# Patient Record
Sex: Female | Born: 1990 | Hispanic: Yes | Marital: Married | Smoking: Never smoker
Health system: Southern US, Community
[De-identification: ages and names within clinical notes are randomized; demographics above are authoritative.]

## PROBLEM LIST (undated history)

## (undated) DIAGNOSIS — J90 Pleural effusion, not elsewhere classified: Secondary | ICD-10-CM

## (undated) DIAGNOSIS — R51 Headache: Secondary | ICD-10-CM

## (undated) DIAGNOSIS — I499 Cardiac arrhythmia, unspecified: Secondary | ICD-10-CM

## (undated) DIAGNOSIS — A150 Tuberculosis of lung: Secondary | ICD-10-CM

## (undated) HISTORY — PX: PLEURAL EFFUSION DRAINAGE: SHX5099

## (undated) HISTORY — PX: NO PAST SURGERIES: SHX2092

---
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
## 2012-01-18 ENCOUNTER — Encounter: Payer: Self-pay | Admitting: Internal Medicine

## 2012-01-21 ENCOUNTER — Other Ambulatory Visit: Payer: Self-pay | Admitting: Infectious Diseases

## 2012-01-21 ENCOUNTER — Ambulatory Visit
Admission: RE | Admit: 2012-01-21 | Discharge: 2012-01-21 | Disposition: A | Discharge status: Home / Self Care | Payer: No Typology Code available for payment source | Source: Ambulatory Visit | Attending: Infectious Diseases | Admitting: Infectious Diseases

## 2012-01-21 DIAGNOSIS — A150 Tuberculosis of lung: Secondary | ICD-10-CM

## 2012-04-04 ENCOUNTER — Ambulatory Visit
Admission: RE | Admit: 2012-04-04 | Discharge: 2012-04-04 | Disposition: A | Discharge status: Home / Self Care | Payer: No Typology Code available for payment source | Source: Ambulatory Visit | Attending: Infectious Diseases | Admitting: Infectious Diseases

## 2012-04-04 ENCOUNTER — Other Ambulatory Visit: Payer: Self-pay | Admitting: Infectious Diseases

## 2012-04-04 DIAGNOSIS — A150 Tuberculosis of lung: Secondary | ICD-10-CM

## 2013-12-06 IMAGING — CR DG CHEST 2V
2 series · 2 of 2 positions shown · non-contrast
Comparison: None

CLINICAL DATA: Fatigue, left chest pain, shortness of breath,
anemia hemoglobin

CHEST - 2 VIEW

[w chest pa]
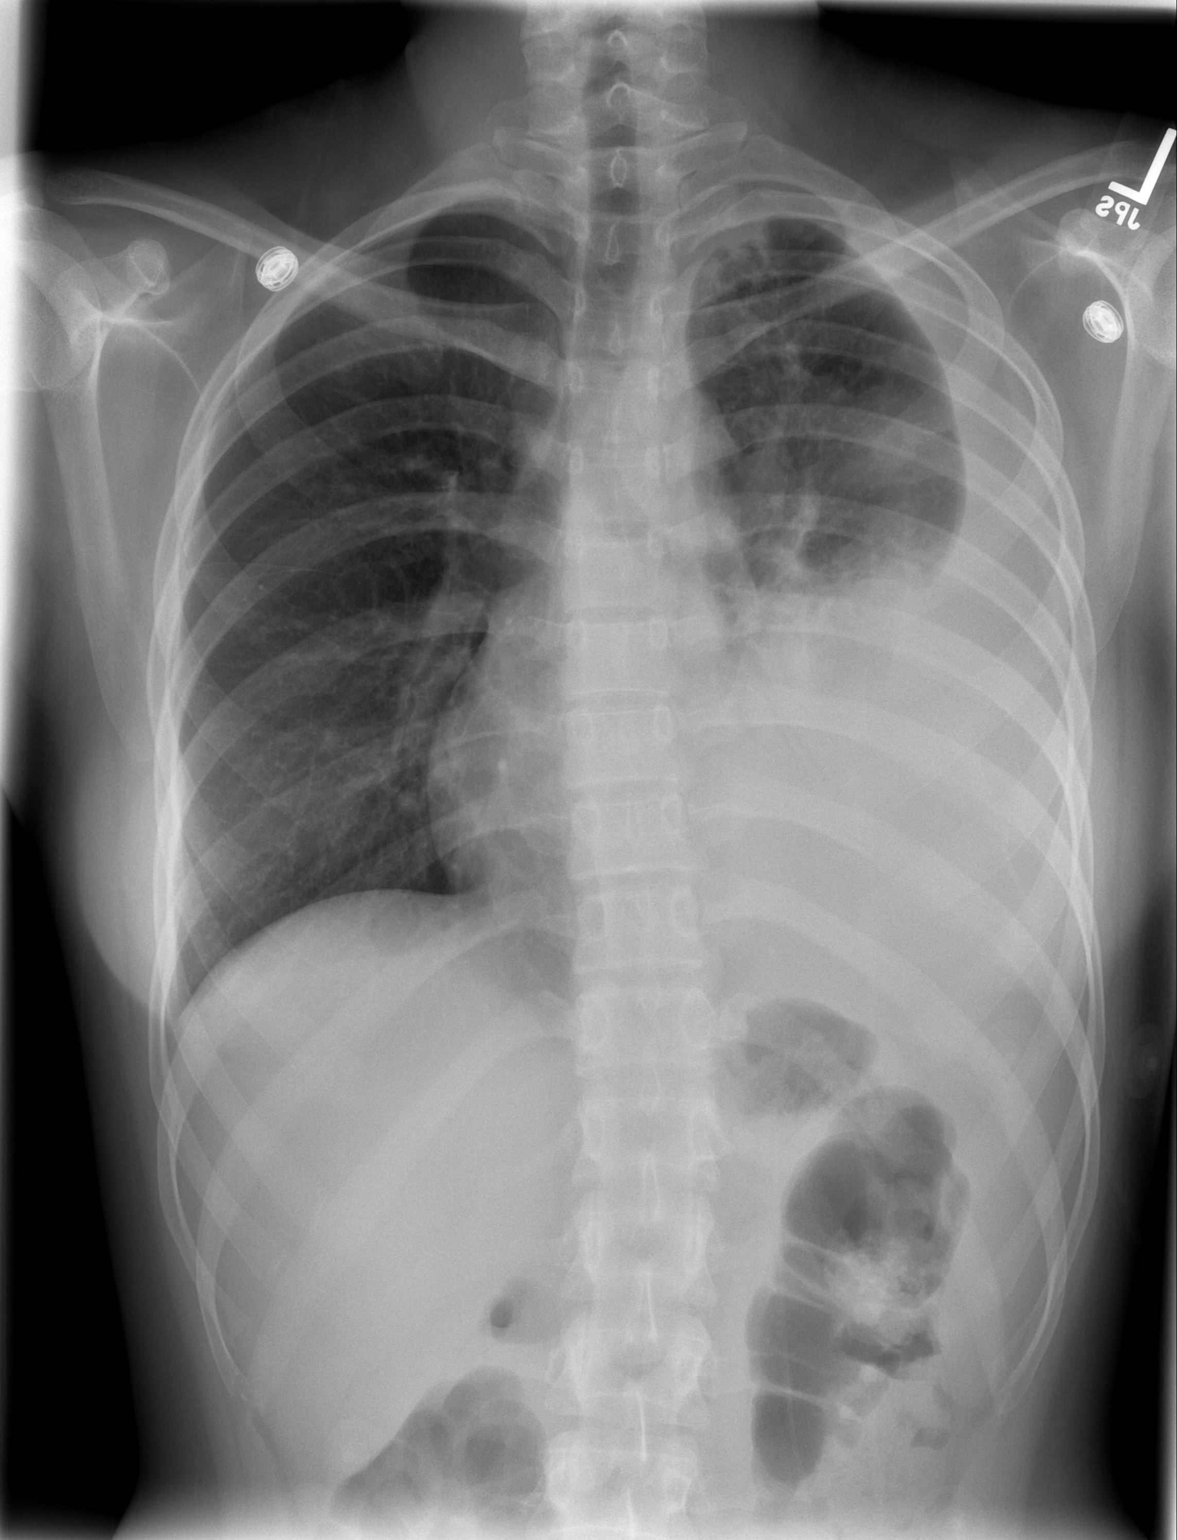

[w chest lat]
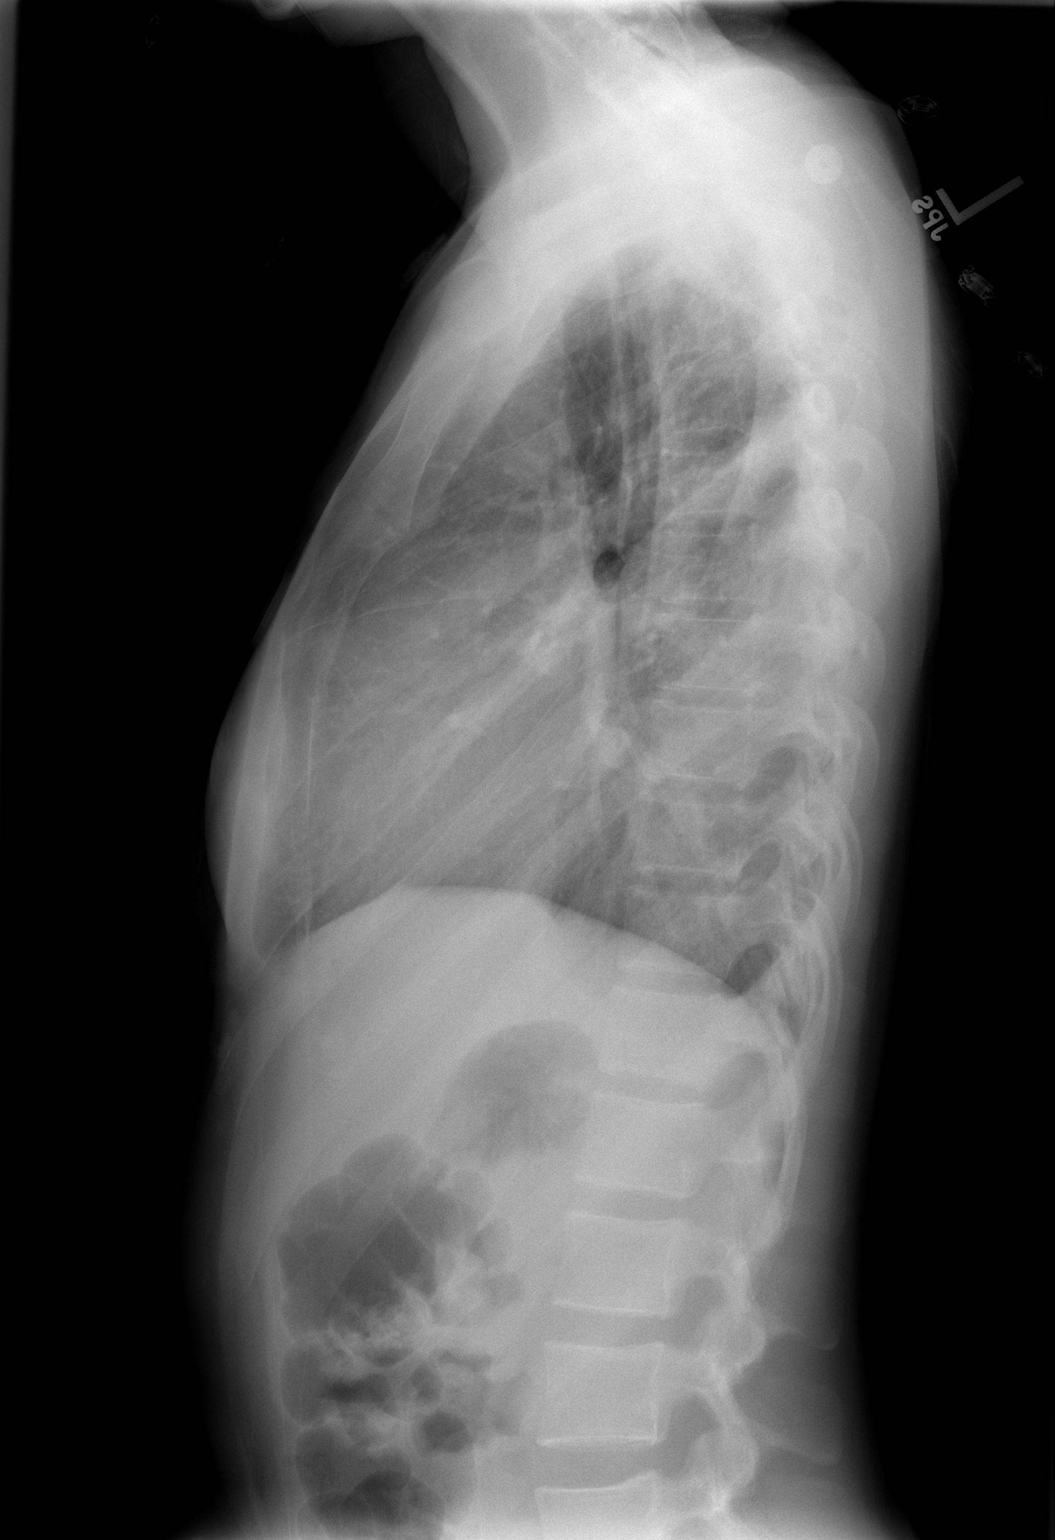

[2 of 2 positions shown; findings below may reference images not displayed]

FINDINGS: Normal heart size and mediastinal contours.
Large left pleural effusion.
Atelectasis of the lower left lung with probable left upper lobe
infiltrate.
Right lung clear.
No pneumothorax.
No acute osseous findings.
IMPRESSION: Large left pleural effusion with significant atelectasis of the
lower left lung.
Probable infiltrate in the left upper lobe.
Underlying abnormalities in the lower left lung are not excluded in
this setting.

Findings discussed with Dr. Maura on 10/02/2011 at 2987 hours.

## 2013-12-06 IMAGING — US US THORACENTESIS ASP PLEURAL SPACE W/IMG GUIDE
1 series · 5 of 5 positions shown · non-contrast
Comparison: None

CLINICAL DATA: Fatigue, left flank pain, left-sided pleural
effusion of uncertain etiology in a 21-year-old female

ULTRASOUND GUIDED left THORACENTESIS

[Series 1: us thoracentesis asp pleural space w/img guide · 0.32mm/px · 5 of 5 slices shown]
[im 1/5]
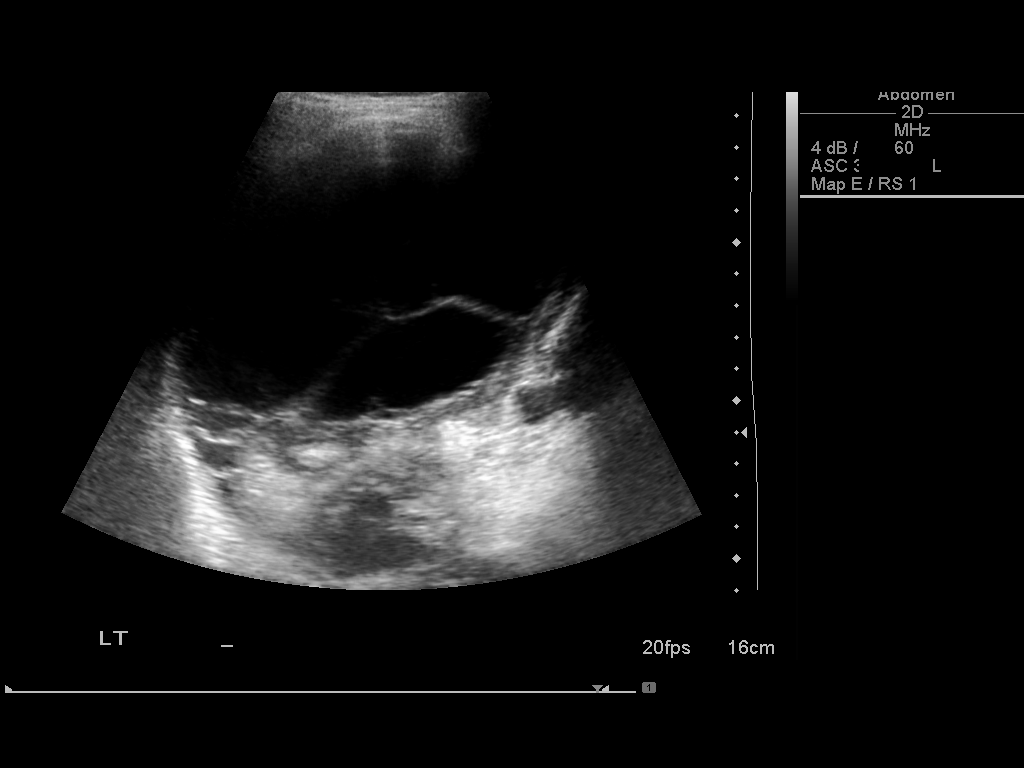
[im 2/5]
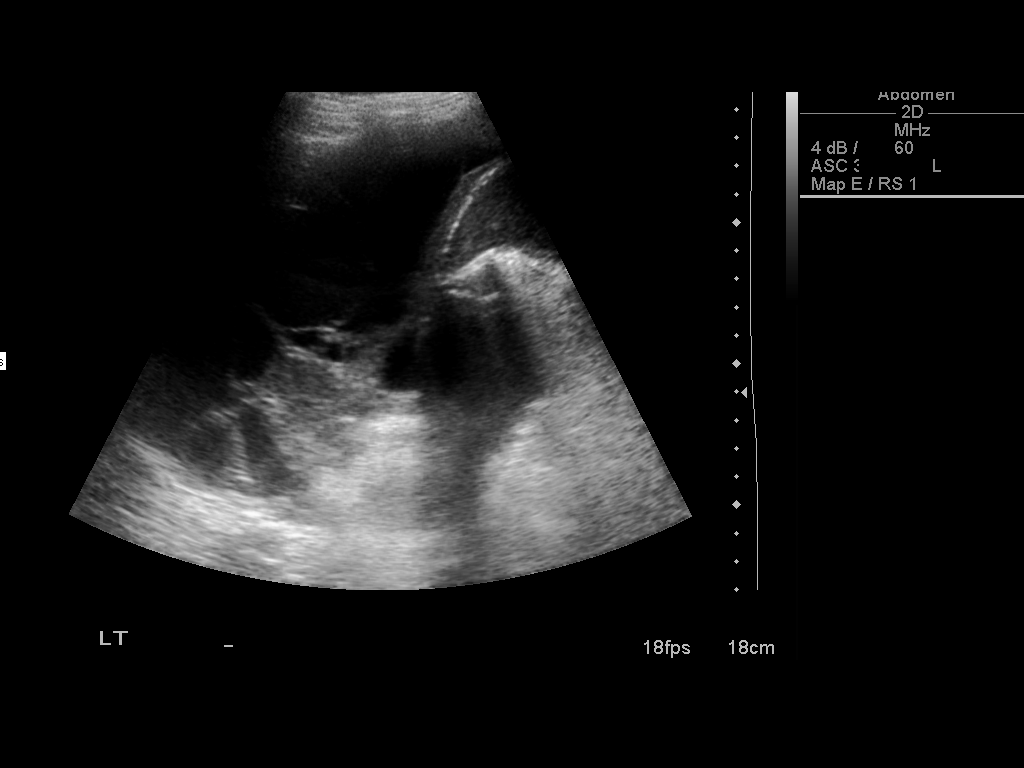
[im 3/5]
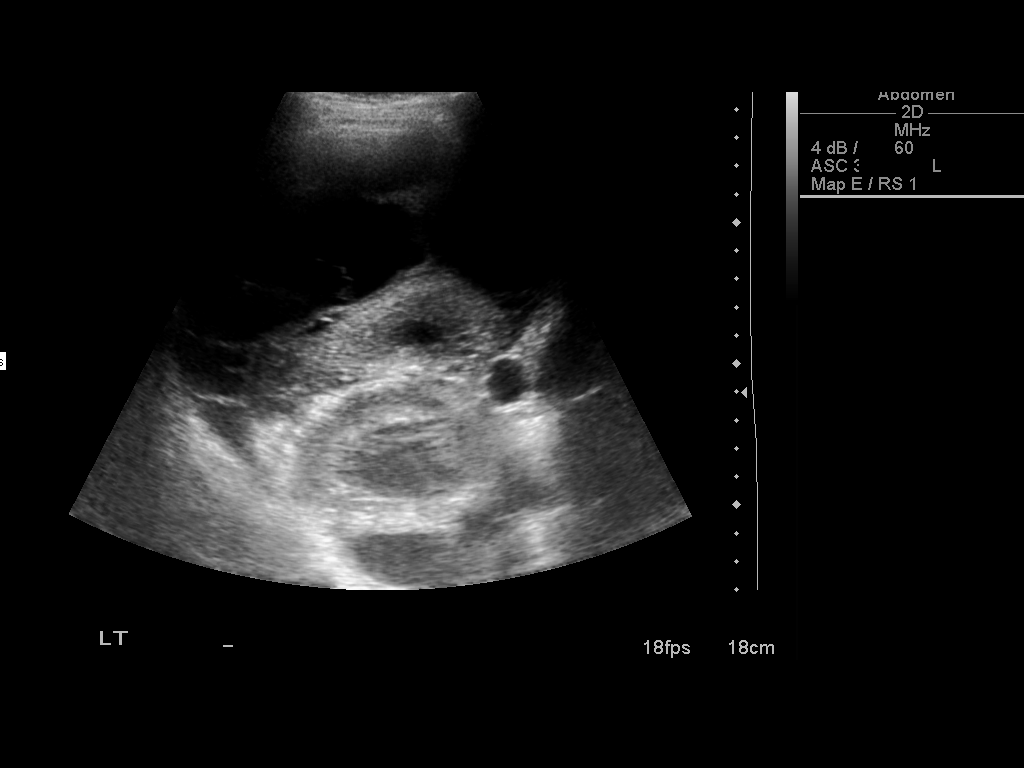
[im 4/5]
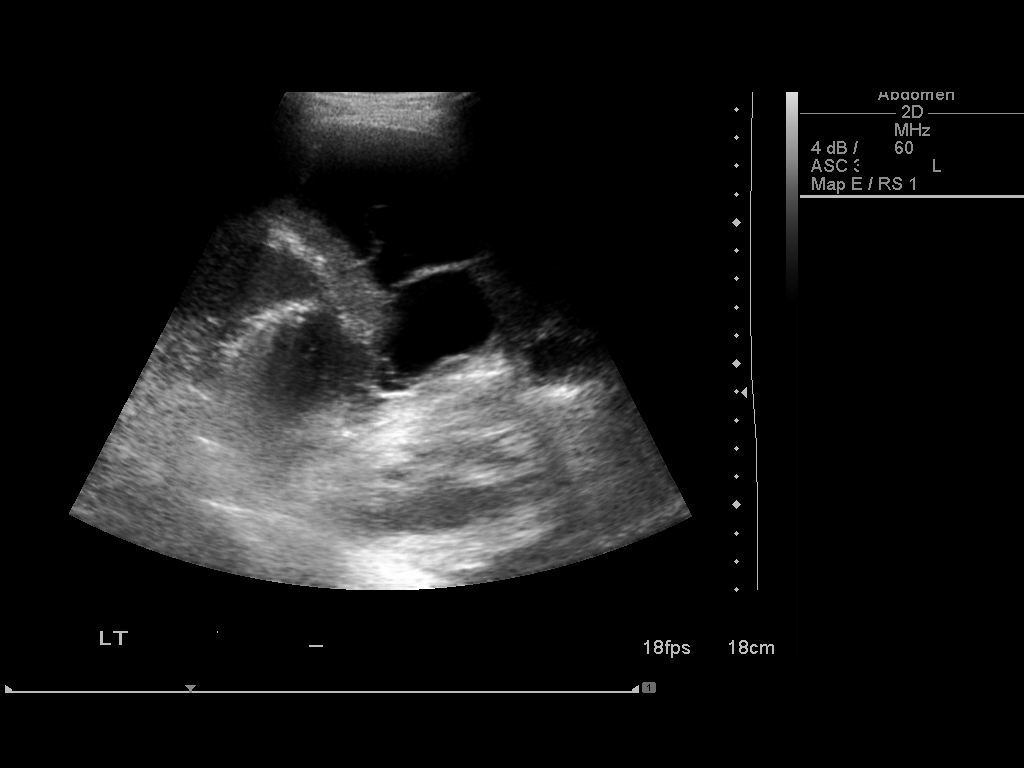
[im 5/5]
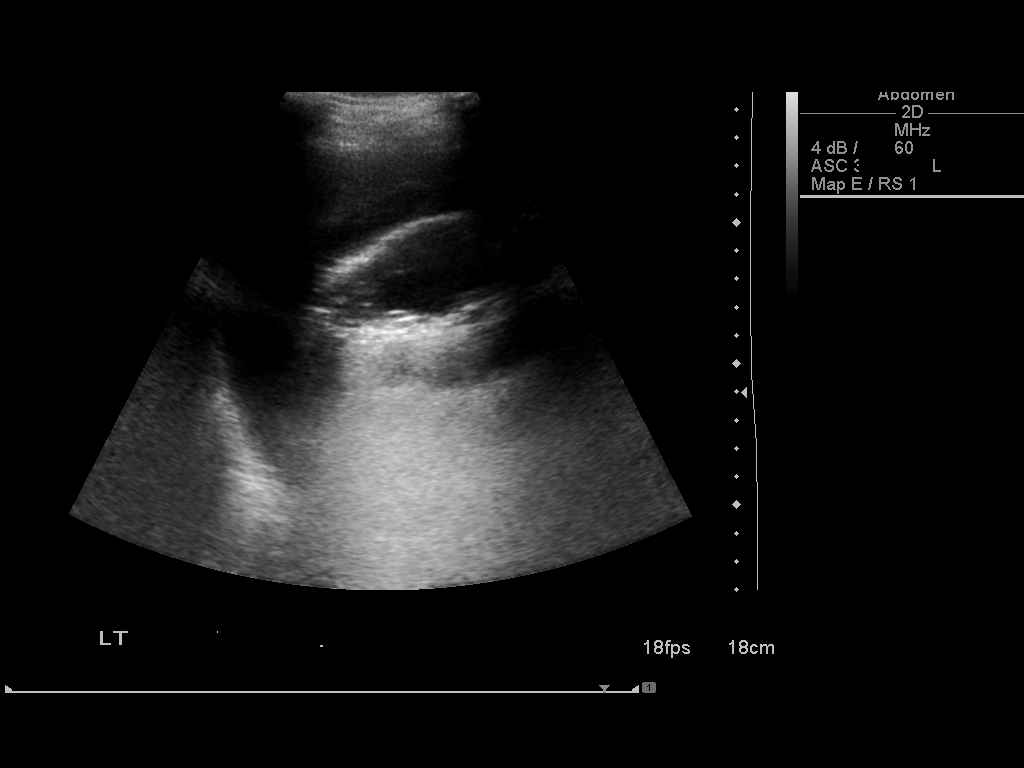

[5 of 5 positions shown; findings below may reference images not displayed]

An ultrasound guided thoracentesis was thoroughly discussed with
the patient and questions answered.  The benefits, risks,
alternatives and complications were also discussed.  The patient
understands and wishes to proceed with the procedure.  Written
consent was obtained.

Ultrasound was performed to localize and mark an adequate pocket of
fluid in the left chest.  The effusion is noted to be very
loculated and the largest of the collections was targeted. The area
was then prepped and draped in the normal sterile fashion.  1%
Lidocaine was used for local anesthesia.  Under ultrasound guidance
a 19 gauge Yueh catheter was introduced.  Thoracentesis was
performed.  The catheter was removed and a dressing applied.

Complications:  The patient experienced mild referred (L)shoulder
discomfort near the end of the procedure.
FINDINGS: A total of approximately 500 ml of clear yellow fluid was
removed. A fluid sample was sent for laboratory analysis.
IMPRESSION: Successful ultrasound guided left thoracentesis yielding 500 ml of
pleural fluid.
Residual loculated left pleural effusion.

Read by Friskydon Bacchas

## 2017-03-12 NOTE — L&D Delivery Note (Signed)
[facility]N [facility] Practice Delivery Note  Gloria Kent is a 27 y.o. G1P1001 s/p IOL at [redacted]w[redacted]d. She was admitted for IOL.   ROM: 21h 62m with clear fluid GBS Status: positive Maximum Maternal Temperature: 100  Labor Progress: . Slow 2nd stage, total 7 hours  Delivery Date/Time: [phone] Delivery: Vacuum-assisted vaginal delivery I was called regarding patient at approximately 0 610 At that time I was informed that she had been complete since approximately 00 30 She was allowed to "labor down" for a couple of hours She then pushed for an hour so rested for an hour or so pushed for an hour or so and then they called me I was basically called for evaluation of an operative vaginal delivery  Fetal heart rate tracing was reassuring Labor was deemed to be somewhat suboptimal for pushing While pushing with the patient for a couple of contractions her maternal effort was poor mostly related I thought to the density of her epidural, she was completely unaware of her contractions and had to be told when to push  At that time I made the decision to turn off the epidural and to start increasing the oxytocin once again The patient began to get more of an urge to push and so she began to push with every contractions sometimes every other contraction By about 07 30 she was pushing effectively and there was positive further molding of the fetal vertex and positive station advance  At this point I discussed placing the vacuum extractor with the patient and her husband and they agreed to proceed with that  I applied the bail vacuum extractor and would remove the suction between contractions  The baby was delivered in 6 contractions using the vacuum extractor There were no pop offs  Delivery of the shoulders was accomplished by rotating the anterior shoulder from approximately 3:00 to 12:00 This was probably the reason for the slow descent and prolonged second stage along with poor maternal  effort  The resulting partial third-degree laceration was repaired but difficulty The rectal sphincter fascia was reinforced with 2-0 Monocryl The vaginal mucosa and perineum were closed using 3-0 Monocryl interrupted There was good return of anatomy after the repair was finished  Placenta: intact Complications: none Lacerations: partial 3rd degree EBL: 500cc  Postpartum Planning [ ]  message to sent to schedule follow-up  [ ]  vaccines UTD  Infant: 8  APGARs 9  [id number]   Rockne Coons MD Attending Physician for [facility]

## 2017-05-20 DIAGNOSIS — Z3402 Encounter for supervision of normal first pregnancy, second trimester: Secondary | ICD-10-CM | POA: Diagnosis not present

## 2017-05-20 LAB — OB RESULTS CONSOLE ABO/RH: RH Type: POSITIVE

## 2017-05-20 LAB — OB RESULTS CONSOLE RUBELLA ANTIBODY, IGM: RUBELLA: IMMUNE

## 2017-05-20 LAB — OB RESULTS CONSOLE GBS: STREP GROUP B AG: POSITIVE

## 2017-05-20 LAB — OB RESULTS CONSOLE GC/CHLAMYDIA
CHLAMYDIA, DNA PROBE: NEGATIVE
GC PROBE AMP, GENITAL: NEGATIVE

## 2017-05-20 LAB — OB RESULTS CONSOLE HEPATITIS B SURFACE ANTIGEN: Hepatitis B Surface Ag: NEGATIVE

## 2017-05-20 LAB — OB RESULTS CONSOLE HIV ANTIBODY (ROUTINE TESTING): HIV: NONREACTIVE

## 2017-05-20 LAB — OB RESULTS CONSOLE ANTIBODY SCREEN: Antibody Screen: NEGATIVE

## 2017-05-20 LAB — OB RESULTS CONSOLE RPR: RPR: NONREACTIVE

## 2017-09-20 ENCOUNTER — Other Ambulatory Visit (HOSPITAL_COMMUNITY): Payer: Self-pay | Admitting: Nurse Practitioner

## 2017-09-20 DIAGNOSIS — Z3689 Encounter for other specified antenatal screening: Secondary | ICD-10-CM

## 2017-09-23 ENCOUNTER — Encounter (HOSPITAL_COMMUNITY): Payer: Self-pay

## 2017-09-30 ENCOUNTER — Ambulatory Visit (HOSPITAL_COMMUNITY)
Admission: RE | Admit: 2017-09-30 | Discharge: 2017-09-30 | Disposition: A | Discharge status: Home / Self Care | Payer: Medicaid Other | Source: Ambulatory Visit | Attending: Nurse Practitioner | Admitting: Nurse Practitioner

## 2017-09-30 DIAGNOSIS — Z363 Encounter for antenatal screening for malformations: Secondary | ICD-10-CM | POA: Diagnosis present

## 2017-09-30 DIAGNOSIS — Z3689 Encounter for other specified antenatal screening: Secondary | ICD-10-CM

## 2017-09-30 DIAGNOSIS — Z3A36 36 weeks gestation of pregnancy: Secondary | ICD-10-CM | POA: Diagnosis not present

## 2017-10-28 ENCOUNTER — Telehealth (HOSPITAL_COMMUNITY): Payer: Self-pay | Admitting: *Deleted

## 2017-10-28 ENCOUNTER — Other Ambulatory Visit (HOSPITAL_COMMUNITY): Payer: Self-pay | Admitting: Nurse Practitioner

## 2017-10-28 ENCOUNTER — Encounter (HOSPITAL_COMMUNITY): Payer: Self-pay | Admitting: *Deleted

## 2017-10-28 DIAGNOSIS — O480 Post-term pregnancy: Secondary | ICD-10-CM

## 2017-10-28 NOTE — Telephone Encounter (Signed)
Preadmission screen  

## 2017-10-29 ENCOUNTER — Other Ambulatory Visit: Payer: Self-pay

## 2017-10-29 ENCOUNTER — Inpatient Hospital Stay (HOSPITAL_COMMUNITY)
Admission: AD | Admit: 2017-10-29 | Discharge: 2017-10-29 | Disposition: A | Discharge status: Home / Self Care | Payer: Medicaid Other | Source: Ambulatory Visit | Attending: Obstetrics and Gynecology | Admitting: Obstetrics and Gynecology

## 2017-10-29 ENCOUNTER — Encounter (HOSPITAL_COMMUNITY): Payer: Self-pay

## 2017-10-29 DIAGNOSIS — O368130 Decreased fetal movements, third trimester, not applicable or unspecified: Secondary | ICD-10-CM | POA: Diagnosis present

## 2017-10-29 DIAGNOSIS — Z3A40 40 weeks gestation of pregnancy: Secondary | ICD-10-CM | POA: Diagnosis not present

## 2017-10-29 DIAGNOSIS — Z3689 Encounter for other specified antenatal screening: Secondary | ICD-10-CM

## 2017-10-29 NOTE — Discharge Instructions (Signed)

## 2017-10-29 NOTE — MAU Note (Signed)
Pt states that she started feeling her baby move less than usual last night.   She states she felt a small movement on the way over here in the car.  Denies vaginal bleeding, Denies LOF

## 2017-10-29 NOTE — MAU Provider Note (Signed)
History     CSN: [id number]  Arrival date and time: 10/29/17 2030   First Provider Initiated Contact with Patient 10/29/17 2117      Chief Complaint  Patient presents with  . Decreased Fetal Movement   G1 @40 .1 wks here with decreased FM since last night. Denies VB, LOF, or ctx. She is eating and drinking well. She is feeling FM but less than before yesterday. She is getting [facility] at [facility], no complications.   OB History    Gravida  1   Para      Term      Preterm      AB      Living        SAB      TAB      Ectopic      Multiple      Live Births              Past Medical History:  Diagnosis Date  . Dysrhythmia 10/02/11   "lately heart is beating faster"  . Headache(784.0) 10/02/11   "weekly"  . Pleural effusion 10/02/11  . TB (pulmonary tuberculosis) questionable   hospitalized in 2014 treated until negative    Past Surgical History:  Procedure Laterality Date  . NO PAST SURGERIES    . PLEURAL EFFUSION DRAINAGE  10/04/2011   Procedure: DRAINAGE OF PLEURAL EFFUSION;  Surgeon: Alleen BorneBryan K Bartle, MD;  Location: [facility] OR;  Service: Thoracic;  Laterality: Left;    Family History  Problem Relation Age of Onset  . Hypertension Maternal Grandmother     Social History   Tobacco Use  . Smoking status: Never Smoker  . Smokeless tobacco: Never Used  Substance Use Topics  . Alcohol use: No  . Drug use: No    Allergies: No Known Allergies  Medications Prior to Admission  Medication Sig Dispense Refill Last Dose  . traMADol (ULTRAM) 50 MG tablet Take 50-100 mg by mouth every 6 (six) hours as needed. For pain   Not Taking    Review of Systems  Gastrointestinal: Negative for abdominal pain.  Genitourinary: Negative for vaginal bleeding and vaginal discharge.   Physical Exam   Blood pressure 120/70, pulse 91, temperature 97.8 F (36.6 C), resp. rate 16, weight 74.4 kg, SpO2 99 %.  Physical Exam  Constitutional: She is oriented to person, place, and  time. She appears well-developed and well-nourished. No distress.  HENT:  Head: Normocephalic and atraumatic.  Neck: Normal range of motion.  Cardiovascular: Normal rate.  Respiratory: No respiratory distress.  GI: Soft. She exhibits no distension. There is no tenderness.  gravid  Musculoskeletal: Normal range of motion.  Neurological: She is alert and oriented to person, place, and time.  Skin: Skin is warm and dry.  Psychiatric: She has a normal mood and affect.  EFM: 130 bpm, mod variability, + accels, no decels Toco: irregular  No results found for this or any previous visit (from the past 24 hour(s)).  MAU Course  Procedures  MDM Pt feeling good FM since EFM applied. NST reactive. Stable for discharge home.   Assessment and Plan   1. [redacted] weeks gestation of pregnancy   2. NST (non-stress test) reactive   3. Decreased fetal movements in third trimester, single or unspecified fetus    Discharge home [facility] as scheduled FMCs  Allergies as of 10/29/2017   No Known Allergies     Medication List    STOP taking  these medications   traMADol 50 MG tablet Commonly known as:  Julieanne MansonULTRAM      Gracey Tolle, CNM 10/29/2017, 9:18 PM

## 2017-10-30 ENCOUNTER — Other Ambulatory Visit: Payer: Self-pay | Admitting: Advanced Practice Midwife

## 2017-10-31 ENCOUNTER — Ambulatory Visit (HOSPITAL_COMMUNITY)
Admission: RE | Admit: 2017-10-31 | Discharge: 2017-10-31 | Disposition: A | Discharge status: Home / Self Care | Payer: Medicaid Other | Source: Ambulatory Visit | Attending: Nurse Practitioner | Admitting: Nurse Practitioner

## 2017-10-31 DIAGNOSIS — O480 Post-term pregnancy: Secondary | ICD-10-CM | POA: Diagnosis present

## 2017-10-31 DIAGNOSIS — Z3A40 40 weeks gestation of pregnancy: Secondary | ICD-10-CM | POA: Insufficient documentation

## 2017-11-02 ENCOUNTER — Encounter (HOSPITAL_COMMUNITY): Payer: Self-pay | Admitting: *Deleted

## 2017-11-02 ENCOUNTER — Inpatient Hospital Stay (HOSPITAL_COMMUNITY)
Admission: AD | Admit: 2017-11-02 | Discharge: 2017-11-06 | DRG: 768 | Disposition: A | Discharge status: Home / Self Care | Payer: Medicaid Other | Attending: Obstetrics and Gynecology | Admitting: Obstetrics and Gynecology

## 2017-11-02 DIAGNOSIS — O4693 Antepartum hemorrhage, unspecified, third trimester: Secondary | ICD-10-CM | POA: Diagnosis present

## 2017-11-02 DIAGNOSIS — O99824 Streptococcus B carrier state complicating childbirth: Secondary | ICD-10-CM | POA: Diagnosis present

## 2017-11-02 DIAGNOSIS — Z8611 Personal history of tuberculosis: Secondary | ICD-10-CM

## 2017-11-02 DIAGNOSIS — Z3A40 40 weeks gestation of pregnancy: Secondary | ICD-10-CM | POA: Diagnosis not present

## 2017-11-02 DIAGNOSIS — O480 Post-term pregnancy: Principal | ICD-10-CM | POA: Diagnosis present

## 2017-11-02 LAB — TYPE AND SCREEN
ABO/RH(D): O POS
Antibody Screen: NEGATIVE

## 2017-11-02 LAB — CBC
HCT: 31.7 % — ABNORMAL LOW (ref 36.0–46.0)
Hemoglobin: 9.5 g/dL — ABNORMAL LOW (ref 12.0–15.0)
MCH: 20.8 pg — AB (ref 26.0–34.0)
MCHC: 30 g/dL (ref 30.0–36.0)
MCV: 69.5 fL — AB (ref 78.0–100.0)
Platelets: 248 10*3/uL (ref 150–400)
RBC: 4.56 MIL/uL (ref 3.87–5.11)
RDW: 21.6 % — ABNORMAL HIGH (ref 11.5–15.5)
WBC: 5 10*3/uL (ref 4.0–10.5)

## 2017-11-02 LAB — RPR: RPR: NONREACTIVE

## 2017-11-02 LAB — ABO/RH: ABO/RH(D): O POS

## 2017-11-02 MED ORDER — LACTATED RINGERS IV SOLN
500.0000 mL | INTRAVENOUS | Status: DC | PRN
  Administered 2017-11-03 (×2): 500 mL via INTRAVENOUS
  Administered 2017-11-04: 1000 mL via INTRAVENOUS

## 2017-11-02 MED ORDER — ZOLPIDEM TARTRATE 5 MG PO TABS: 5.0000 mg | ORAL_TABLET | Freq: Every evening | ORAL | Status: DC | PRN

## 2017-11-02 MED ORDER — LIDOCAINE HCL (PF) 1 % IJ SOLN
30.0000 mL | INTRAMUSCULAR | Status: AC | PRN
  Administered 2017-11-04 (×2): 30 mL via SUBCUTANEOUS
  Filled 2017-11-02 (×2): qty 30

## 2017-11-02 MED ORDER — SOD CITRATE-CITRIC ACID 500-334 MG/5ML PO SOLN: 30.0000 mL | ORAL | Status: DC | PRN

## 2017-11-02 MED ORDER — OXYCODONE-ACETAMINOPHEN 5-325 MG PO TABS: 1.0000 | ORAL_TABLET | ORAL | Status: DC | PRN

## 2017-11-02 MED ORDER — FENTANYL CITRATE (PF) 100 MCG/2ML IJ SOLN
100.0000 ug | INTRAMUSCULAR | Status: DC | PRN
  Administered 2017-11-03 (×2): 100 ug via INTRAVENOUS
  Filled 2017-11-02 (×2): qty 2

## 2017-11-02 MED ORDER — PENICILLIN G 3 MILLION UNITS IVPB - SIMPLE MED
3.0000 10*6.[IU] | INTRAVENOUS | Status: DC
  Administered 2017-11-02 – 2017-11-04 (×12): 3 10*6.[IU] via INTRAVENOUS
  Filled 2017-11-02: qty 100
  Filled 2017-11-02 (×5): qty 3
  Filled 2017-11-02: qty 100
  Filled 2017-11-02: qty 3
  Filled 2017-11-02 (×2): qty 100
  Filled 2017-11-02 (×2): qty 3
  Filled 2017-11-02: qty 100
  Filled 2017-11-02: qty 3
  Filled 2017-11-02: qty 100
  Filled 2017-11-02 (×3): qty 3

## 2017-11-02 MED ORDER — LACTATED RINGERS IV SOLN
INTRAVENOUS | Status: DC
  Administered 2017-11-02 – 2017-11-03 (×4): via INTRAVENOUS

## 2017-11-02 MED ORDER — ONDANSETRON HCL 4 MG/2ML IJ SOLN
4.0000 mg | Freq: Four times a day (QID) | INTRAMUSCULAR | Status: DC | PRN
  Administered 2017-11-03 (×2): 4 mg via INTRAVENOUS
  Filled 2017-11-02 (×2): qty 2

## 2017-11-02 MED ORDER — OXYTOCIN BOLUS FROM INFUSION
500.0000 mL | Freq: Once | INTRAVENOUS | Status: AC
  Administered 2017-11-04: 500 mL via INTRAVENOUS

## 2017-11-02 MED ORDER — SODIUM CHLORIDE 0.9 % IV SOLN
5.0000 10*6.[IU] | Freq: Once | INTRAVENOUS | Status: AC
  Administered 2017-11-02: 5 10*6.[IU] via INTRAVENOUS
  Filled 2017-11-02: qty 5

## 2017-11-02 MED ORDER — OXYCODONE-ACETAMINOPHEN 5-325 MG PO TABS: 2.0000 | ORAL_TABLET | ORAL | Status: DC | PRN

## 2017-11-02 MED ORDER — TERBUTALINE SULFATE 1 MG/ML IJ SOLN
0.2500 mg | Freq: Once | INTRAMUSCULAR | Status: DC | PRN
  Filled 2017-11-02: qty 1

## 2017-11-02 MED ORDER — OXYTOCIN 40 UNITS IN LACTATED RINGERS INFUSION - SIMPLE MED
2.5000 [IU]/h | INTRAVENOUS | Status: DC
  Administered 2017-11-04: 2.5 [IU]/h via INTRAVENOUS

## 2017-11-02 MED ORDER — MISOPROSTOL 50MCG HALF TABLET
50.0000 ug | ORAL_TABLET | ORAL | Status: DC | PRN
  Administered 2017-11-02 – 2017-11-03 (×4): 50 ug via BUCCAL
  Filled 2017-11-02 (×5): qty 1

## 2017-11-02 MED ORDER — ACETAMINOPHEN 325 MG PO TABS
650.0000 mg | ORAL_TABLET | ORAL | Status: DC | PRN
  Administered 2017-11-03: 650 mg via ORAL
  Filled 2017-11-02: qty 2

## 2017-11-02 NOTE — Progress Notes (Signed)
Gloria Kent is a 27 y.o. G1P0 at 6980w5d by LMP admitted for IOL after presenting with vaginal bleeding. Placenta previa noted in early pregnancy, now resolved (2.1 cm from os)  Subjective:   Objective: BP 122/79   Pulse 73   Temp 98.6 F (37 C) (Oral)   Resp 15   Ht 5\' 3"  (1.6 m)   Wt 73.5 kg   BMI 28.70 kg/m  No intake/output data recorded. No intake/output data recorded.  FHT:  FHR: 140 bpm, variability: moderate,  accelerations:  Present,  decelerations:  Absent UC:   regular, every 5-10 minutes SVE:   Dilation: Fingertip Effacement (%): Thick Station: -3 Exam by:: Gloria Kent, Gloria Kent  Assessment / Plan: Spontaneous labor, progressing normally  Labor: Progressing normally on cytotec Preeclampsia:  no signs or symptoms of toxicity Fetal Wellbeing:  Category I Pain Control:  would like epidural I/D:  GBS positive, receivig PCN Anticipated MOD:  NSVD  Gloria Kent Gloria Kent 11/02/2017, 4:30 PM

## 2017-11-02 NOTE — H&P (Signed)
Gloria Kent is a 27 y.o. female G1P0 @[redacted]w[redacted]d  by LMP with prenatal care at [facility] presenting for vaginal bleeding. She reports onset of bright red vaginal bleeding requiring a pad today.  She is having intermittent abdominal cramping, starting today with the bleeding, but pain is mild and unchanged in intensity.  There are no other associated symptoms. She had a placenta previa in early pregnancy that resolved and was 1.8 cm from os @ 28 weeks, then normal anterior position above os @ 32 weeks.  Otherwise pregnancy has been uncomplicated.  Pt hx includes pulmonary TB with pulmonary edema treated in 2014 at [facility]. Pt completed therapy for TB.   OB History    Gravida  1   Para      Term      Preterm      AB      Living        SAB      TAB      Ectopic      Multiple      Live Births             Past Medical History:  Diagnosis Date  . Dysrhythmia 10/02/11   "lately heart is beating faster"  . Headache(784.0) 10/02/11   "weekly"  . Pleural effusion 10/02/11  . TB (pulmonary tuberculosis) questionable   hospitalized in 2014 treated until negative   Past Surgical History:  Procedure Laterality Date  . NO PAST SURGERIES    . PLEURAL EFFUSION DRAINAGE  10/04/2011   Procedure: DRAINAGE OF PLEURAL EFFUSION;  Surgeon: Alleen Borne, MD;  Location: [facility] OR;  Service: Thoracic;  Laterality: Left;   Family History: family history includes Hypertension in her maternal grandmother. Social History:  reports that she has never smoked. She has never used smokeless tobacco. She reports that she does not drink alcohol or use drugs.     Maternal Diabetes: No Genetic Screening: Normal Maternal Ultrasounds/Referrals: Abnormal:  Findings:   Other: Fetal Ultrasounds or other Referrals:  None Maternal Substance Abuse:  No Significant Maternal Medications:  None Significant Maternal Lab Results:  Lab values include: Group B Strep positive Other Comments:  Placenta previa resolved @  32 weeks  Review of Systems  Constitutional: Negative for chills and fever.  Respiratory: Negative for shortness of breath.   Cardiovascular: Negative for chest pain.  Gastrointestinal: Positive for abdominal pain. Negative for constipation, diarrhea, nausea and vomiting.  Genitourinary:       Vaginal bleeding  Neurological: Negative for dizziness and headaches.  All other systems reviewed and are negative.  Maternal Medical History:  Reason for admission: Vaginal bleeding.  Nausea.  Contractions: Onset was 3-5 hours ago.   Frequency: irregular.   Perceived severity is mild.    Fetal activity: Perceived fetal activity is normal.   Last perceived fetal movement was within the past hour.    Prenatal complications: Placenta previa that resolved   Prenatal Complications - Diabetes: none.    Dilation: 2 Effacement (%): 40 Exam by:: L.Left-Wich-Kirby,CNM Blood pressure 119/81, pulse 84, temperature 97.8 F (36.6 C), resp. rate 18, height 5\' 3"  (1.6 m), weight 73.5 kg. Maternal Exam:  Uterine Assessment: Contraction strength is mild.  Contraction frequency is irregular.   Abdomen: Fetal presentation: vertex  Cervix: Cervix evaluated by digital exam.     Fetal Exam Fetal Monitor Review: Mode: ultrasound.   Baseline rate: 135.  Variability: moderate (6-25 bpm).   Pattern: accelerations present and no decelerations.  Fetal State Assessment: Category I - tracings are normal.     Physical Exam  Nursing note and vitals reviewed. Constitutional: She is oriented to person, place, and time. She appears well-developed and well-nourished.  Neck: Normal range of motion.  Cardiovascular: Normal rate, regular rhythm and normal heart sounds.  Respiratory: Effort normal and breath sounds normal.  GI: Soft.  Musculoskeletal: Normal range of motion.  Neurological: She is alert and oriented to person, place, and time.  Skin: Skin is warm and dry.  Psychiatric: She has a normal  mood and affect. Her behavior is normal. Judgment and thought content normal.    Prenatal labs: ABO, Rh: O/Positive/-- (03/11 0000) Antibody: n (03/11 0000) Rubella: Immune (03/11 0000) RPR: Nonreactive (03/11 0000)  HBsAg: Negative (03/11 0000)  HIV: Non-reactive (03/11 0000)  GBS: Positive (03/11 0000)   Assessment/Plan: G1P0 @[redacted]w[redacted]d  with early labor and onset of bright red vaginal bleeding GBS positive  Admit for early labor Expectant management but augmentation if no progress due to vaginal bleeding PCN for GBS prophylaxis Continuous EFM Pt may have epidural when desired   Sharen CounterLisa Leftwich-Kirby 11/02/2017, 2:36 AM

## 2017-11-02 NOTE — MAU Note (Signed)
Having mild ctxs and some bloody mucous.

## 2017-11-02 NOTE — Progress Notes (Signed)
OB/GYN Faculty Practice: Labor Progress Note  Subjective: Doing well. Starting to feel some pressure in vagina, comes/goes.   Objective: BP 118/75   Pulse 80   Temp 98.8 F (37.1 C) (Oral)   Resp 16   Ht 5\' 3"  (1.6 m)   Wt 73.5 kg   BMI 28.70 kg/m  Gen: well-appearing, uncomfortable with contractions Dilation: 3 Effacement (%): 20 Cervical Position: Posterior Station: -3 Presentation: Vertex Exam by:: Dr. Earlene PlaterWallace   Assessment and Plan: 27 y.o. G1P0 7426w5d here for IOL for post-dates vaginal bleeding.   Induction of Labor Post-Dates Vaginal Bleeding: Not yet in labor. Induction started with cytotec at 1440.  -- cytotec x 3 -- pain control: walking  Fetal Well-Being: EFW 6lbs by Leopold's. Cephalic by SVE. -- Category I - continuous fetal monitoring  -- GBS (+)  - getting PCN already, adequate   Jillienne Egner S. Earlene PlaterWallace, DO OB/GYN Fellow, Faculty Practice  11:37 PM

## 2017-11-02 NOTE — Progress Notes (Signed)
Labor Progress Note Gloria MuscatBetsy Alvarez Kent is a 27 y.o. G1P0 at 3848w5d presented for IOL d/t bleeding  S:  No complaints, can feel contractions but they don't hurt.  O:  BP 129/86   Pulse 63   Temp 98.7 F (37.1 C) (Oral)   Resp 16   Ht 5\' 3"  (1.6 m)   Wt 73.5 kg   BMI 28.70 kg/m  EFM: baseline 135 bpm/ mod variability/ + accels Toco: irregular SVE: Dilation: 3 Effacement (%): Thick Cervical Position: Posterior Station: -3 Presentation: Vertex Exam by:: Melanie, CNM    A/P: 27 y.o. G1P0 2348w5d  1. Labor: latent 2. FWB: Cat 1 3. Pain: none requested yet but may have fentanyl or epidural prn 4. PCN for GBS pos   Continue Cytotec for ripening then Pitocin. Anticipate labor progression and SVD.  Marthenia RollingScott Zetta Stoneman, DO 8:46 PM

## 2017-11-02 NOTE — Progress Notes (Signed)
Labor Progress Note Gloria Kent is a 27 y.o. G1P0 at 4455w5d presented for IOL d/t bleeding  S:  Comfortable, no c/o.  O:  BP 117/78   Pulse 63   Temp 98.7 F (37.1 C) (Oral)   Resp 16   Ht 5\' 3"  (1.6 m)   Wt 73.5 kg   BMI 28.70 kg/m  EFM: baseline 135 bpm/ mod variability/ + accels/ occ variable decel Toco: irregular SVE: Dilation: 3 Effacement (%): Thick Cervical Position: Posterior Station: -3 Presentation: Vertex Exam by:: Irja Wheless, CNM    A/P: 27 y.o. G1P0 7555w5d  1. Labor: latent 2. FWB: Cat II 3. Pain: analgesia/anesthesia prn   Continue Cytotec for ripening then Pitocin. Anticipate labor progression and SVD.  Donette LarryMelanie Matsuko Kretz, CNM 7:45 PM

## 2017-11-02 NOTE — Anesthesia Pain Management Evaluation Note (Signed)
  CRNA Pain Management Visit Note  Patient: Gloria Kent, 27 y.o., female  "Hello I am a member of the anesthesia team at [facility]. We have an anesthesia team available at all times to provide care throughout the hospital, including epidural management and anesthesia for C-section. I don't know your plan for the delivery whether it a natural birth, water birth, IV sedation, nitrous supplementation, doula or epidural, but we want to meet your pain goals."   1.Was your pain managed to your expectations on prior hospitalizations?   No prior hospitalizations  2.What is your expectation for pain management during this hospitalization?     IV pain meds  3.How can we help you reach that goal? Support prn  Record the patient's initial score and the patient's pain goal.   Pain: 2  Pain Goal: 6 [facility] wants you to be able to say your pain was always managed very well.  [facility] Care[facility]LE,Gloria Kent 11/02/2017

## 2017-11-03 ENCOUNTER — Inpatient Hospital Stay (HOSPITAL_COMMUNITY): Payer: Medicaid Other | Admitting: Anesthesiology

## 2017-11-03 LAB — CBC
HEMATOCRIT: 34.1 % — AB (ref 36.0–46.0)
Hemoglobin: 10 g/dL — ABNORMAL LOW (ref 12.0–15.0)
MCH: 20.7 pg — AB (ref 26.0–34.0)
MCHC: 29.3 g/dL — AB (ref 30.0–36.0)
MCV: 70.7 fL — ABNORMAL LOW (ref 78.0–100.0)
Platelets: 249 10*3/uL (ref 150–400)
RBC: 4.82 MIL/uL (ref 3.87–5.11)
RDW: 21.2 % — AB (ref 11.5–15.5)
WBC: 9.5 10*3/uL (ref 4.0–10.5)

## 2017-11-03 MED ORDER — LIDOCAINE HCL (PF) 1 % IJ SOLN
INTRAMUSCULAR | Status: DC | PRN
  Administered 2017-11-03 (×2): 5 mL via EPIDURAL

## 2017-11-03 MED ORDER — PHENYLEPHRINE 40 MCG/ML (10ML) SYRINGE FOR IV PUSH (FOR BLOOD PRESSURE SUPPORT)
80.0000 ug | PREFILLED_SYRINGE | INTRAVENOUS | Status: DC | PRN
  Filled 2017-11-03 (×2): qty 10
  Filled 2017-11-03: qty 5

## 2017-11-03 MED ORDER — OXYTOCIN 40 UNITS IN LACTATED RINGERS INFUSION - SIMPLE MED
1.0000 m[IU]/min | INTRAVENOUS | Status: DC
  Administered 2017-11-03: 6 m[IU]/min via INTRAVENOUS
  Administered 2017-11-03: 2 m[IU]/min via INTRAVENOUS
  Filled 2017-11-03: qty 1000

## 2017-11-03 MED ORDER — DIPHENHYDRAMINE HCL 50 MG/ML IJ SOLN: 12.5000 mg | INTRAMUSCULAR | Status: DC | PRN

## 2017-11-03 MED ORDER — PHENYLEPHRINE 40 MCG/ML (10ML) SYRINGE FOR IV PUSH (FOR BLOOD PRESSURE SUPPORT)
80.0000 ug | PREFILLED_SYRINGE | INTRAVENOUS | Status: DC | PRN
  Administered 2017-11-04: 80 ug via INTRAVENOUS
  Filled 2017-11-03: qty 5

## 2017-11-03 MED ORDER — TERBUTALINE SULFATE 1 MG/ML IJ SOLN
0.2500 mg | Freq: Once | INTRAMUSCULAR | Status: DC | PRN
  Filled 2017-11-03: qty 1

## 2017-11-03 MED ORDER — FENTANYL 2.5 MCG/ML BUPIVACAINE 1/10 % EPIDURAL INFUSION (WH - ANES)
14.0000 mL/h | INTRAMUSCULAR | Status: DC | PRN
  Administered 2017-11-03 – 2017-11-04 (×3): 14 mL/h via EPIDURAL
  Filled 2017-11-03 (×3): qty 100

## 2017-11-03 MED ORDER — EPHEDRINE 5 MG/ML INJ
10.0000 mg | INTRAVENOUS | Status: DC | PRN
  Filled 2017-11-03: qty 2

## 2017-11-03 MED ORDER — LACTATED RINGERS IV SOLN
500.0000 mL | Freq: Once | INTRAVENOUS | Status: AC
  Administered 2017-11-03: 500 mL via INTRAVENOUS

## 2017-11-03 MED ORDER — LACTATED RINGERS AMNIOINFUSION
INTRAVENOUS | Status: DC
  Administered 2017-11-03: 23:00:00 via INTRAUTERINE
  Filled 2017-11-03: qty 1000

## 2017-11-03 NOTE — Progress Notes (Addendum)
LABOR PROGRESS NOTE  Gloria Kent is a 27 y.o. G1P0 at 6022w6d  admitted for IOL for bleeding with hx of previa (resolved @32  weeks)  Subjective: Patient comfortable with epidural, not feeling any pressure in bottom   Objective: BP 123/79   Pulse 78   Temp 99 F (37.2 C) (Oral)   Resp 18   Ht 5\' 3"  (1.6 m)   Wt 73.5 kg   BMI 28.70 kg/m  or  Vitals:   11/03/17 1935 11/03/17 2004 11/03/17 2005 11/03/17 2031  BP: 130/68  137/82 123/79  Pulse: 77  74 78  Resp: 18  18 18   Temp:  99 F (37.2 C)    TempSrc:  Oral    Weight:      Height:        Dilation: 6 Effacement (%): 80 Cervical Position: Middle Station: -1 Presentation: Vertex Exam by:: Gwendolyn GrantMadison Hicks, RN  FHT: baseline rate 155, moderate varibility, +acel, variable and early decel Toco: 2-3  Labs: Lab Results  Component Value Date   WBC 9.5 11/03/2017   HGB 10.0 (L) 11/03/2017   HCT 34.1 (L) 11/03/2017   MCV 70.7 (L) 11/03/2017   PLT 249 11/03/2017    Patient Active Problem List   Diagnosis Date Noted  . Post term pregnancy over 40 weeks 11/02/2017  . 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 Microcytic anemia 10/02/2011  . Sinus tachycardia 10/02/2011  . Exudative pleural effusion 10/02/2011  . Protein malnutrition (HCC) 10/02/2011    Assessment / Plan: 27 y.o. G1P0 at 6822w6d here for IOL for bleeding with hx of previa   Labor: Progressing well on pitocin, continue titration of pitocin  Fetal Wellbeing:  Cat II Pain Control:  Epidural  Anticipated MOD:  SVD  Sharyon CableRogers, [name], CNM 11/03/2017, 9:10 PM

## 2017-11-03 NOTE — Progress Notes (Signed)
Labor Progress Note Gloria MuscatBetsy Alvarez Kent is a 27 y.o. G1P0 at 7060w6d presented with vaginal bleeding, now here for IOL.  S:   O:  BP 124/85   Pulse 82   Temp 98.9 F (37.2 C) (Oral)   Resp 18   Ht 5\' 3"  (1.6 m)   Wt 73.5 kg   BMI 28.70 kg/m  EFM: 135/moderate var/no accels/pos early decels  CVE: Dilation: 4 Effacement (%): 80 Cervical Position: Posterior Station: -1 Presentation: Vertex Exam by:: Marcelino DusterMichelle, RN    A&P: 27 y.o. G1P0 8060w6d here for IOL. #Labor: AROM w/ light mec, progressing on pit, cytotec x4 #Pain: Epidural in place #FWB: category I #GBS positive on PCN   Mirian MoPeter Carolanne Mercier, MD 3:45 PM

## 2017-11-03 NOTE — Anesthesia Procedure Notes (Signed)
Epidural Patient location during procedure: OB  Staffing Anesthesiologist: Alessandro Griep, MD Performed: anesthesiologist   Preanesthetic Checklist Completed: patient identified, site marked, surgical consent, pre-op evaluation, timeout performed, IV checked, risks and benefits discussed and monitors and equipment checked  Epidural Patient position: sitting Prep: DuraPrep Patient monitoring: heart rate, continuous pulse ox and blood pressure Approach: right paramedian Location: L3-L4 Injection technique: LOR saline  Needle:  Needle type: Tuohy  Needle gauge: 17 G Needle length: 9 cm and 9 Needle insertion depth: 6 cm Catheter type: closed end flexible Catheter size: 20 Guage Catheter at skin depth: 10 cm Test dose: negative  Assessment Events: blood not aspirated, injection not painful, no injection resistance, negative IV test and no paresthesia  Additional Notes Patient identified. Risks/Benefits/Options discussed with patient including but not limited to bleeding, infection, nerve damage, paralysis, failed block, incomplete pain control, headache, blood pressure changes, nausea, vomiting, reactions to medication both or allergic, itching and postpartum back pain. Confirmed with bedside nurse the patient's most recent platelet count. Confirmed with patient that they are not currently taking any anticoagulation, have any bleeding history or any family history of bleeding disorders. Patient expressed understanding and wished to proceed. All questions were answered. Sterile technique was used throughout the entire procedure. Please see nursing notes for vital signs. Test dose was given through epidural needle and negative prior to continuing to dose epidural or start infusion. Warning signs of high block given to the patient including shortness of breath, tingling/numbness in hands, complete motor block, or any concerning symptoms with instructions to call for help. Patient was given  instructions on fall risk and not to get out of bed. All questions and concerns addressed with instructions to call with any issues.     

## 2017-11-03 NOTE — Anesthesia Preprocedure Evaluation (Signed)

## 2017-11-03 NOTE — Progress Notes (Signed)
Labor Progress Note Gloria Kent is a 27 y.o. G1P0 at 2625w6d presented for with vaginal bleeding, now here for IOL.  S:  Pt has requested c-section.  O:  BP 133/86   Pulse 74   Temp 99 F (37.2 C) (Oral)   Resp 18   Ht 5\' 3"  (1.6 m)   Wt 73.5 kg   BMI 28.70 kg/m  EFM: 155/moderate var/pos early decels/ few variable decels  CVE: Dilation: 4.5 Effacement (%): 80 Cervical Position: Posterior, Middle Station: -1 Presentation: Vertex Exam by:: Marcelino DusterMichelle, RN    A&P: 27 y.o. G1P0 5025w6d here for IOL. #Labor: Progressing on pit. Dr. Despina HiddenEure reviewed pt's strip and chart and found them to be reassuring and without indication for c-section at this time.  #Pain: epidural in place #FWB: category II #GBS positive on PCN  Mirian MoPeter Makala Fetterolf, MD 7:11 PM

## 2017-11-03 NOTE — Progress Notes (Signed)
Labor Progress Note Gloria Kent is a 27 y.o. G1P0 at 838w6d presented with vaginal bleeding, no here for IOL.  S:   O:  BP (!) 122/93   Pulse 92   Temp 98.5 F (36.9 C) (Oral)   Resp 18   Ht 5\' 3"  (1.6 m)   Wt 73.5 kg   BMI 28.70 kg/m  EFM: 140/moderate var/no accels/positive early decels  CVE: Dilation: 3 Effacement (%): 80 Cervical Position: Posterior Station: -1 Presentation: Vertex Exam by:: Gloria Kent, CNM    A&P: 27 y.o. G1P0 1738w6d presented with vaginal bleeding, no here for IOL.   #Labor: Progressing. AROM 10:17 10/24/2017, pit ordered, cytotec given x4 #Pain: would like epidural #FWB: Category I #GBS positive on PCN   Gloria MoPeter Odester Nilson, MD 10:51 AM

## 2017-11-04 ENCOUNTER — Inpatient Hospital Stay (HOSPITAL_COMMUNITY): Admission: RE | Admit: 2017-11-04 | Payer: Medicaid Other | Source: Ambulatory Visit

## 2017-11-04 ENCOUNTER — Encounter (HOSPITAL_COMMUNITY): Payer: Self-pay

## 2017-11-04 DIAGNOSIS — O480 Post-term pregnancy: Secondary | ICD-10-CM

## 2017-11-04 DIAGNOSIS — Z3A40 40 weeks gestation of pregnancy: Secondary | ICD-10-CM

## 2017-11-04 LAB — GLUCOSE, CAPILLARY: GLUCOSE-CAPILLARY: 105 mg/dL — AB (ref 70–99)

## 2017-11-04 MED ORDER — COCONUT OIL OIL
1.0000 "application " | TOPICAL_OIL | Status: DC | PRN
  Administered 2017-11-05: 1 via TOPICAL
  Filled 2017-11-04: qty 120

## 2017-11-04 MED ORDER — WITCH HAZEL-GLYCERIN EX PADS: 1.0000 "application " | MEDICATED_PAD | CUTANEOUS | Status: DC | PRN

## 2017-11-04 MED ORDER — DIBUCAINE 1 % RE OINT: 1.0000 "application " | TOPICAL_OINTMENT | RECTAL | Status: DC | PRN

## 2017-11-04 MED ORDER — ACETAMINOPHEN 325 MG PO TABS
650.0000 mg | ORAL_TABLET | ORAL | Status: DC | PRN
  Administered 2017-11-05 – 2017-11-06 (×2): 650 mg via ORAL
  Filled 2017-11-04 (×2): qty 2

## 2017-11-04 MED ORDER — DIPHENHYDRAMINE HCL 25 MG PO CAPS: 25.0000 mg | ORAL_CAPSULE | Freq: Four times a day (QID) | ORAL | Status: DC | PRN

## 2017-11-04 MED ORDER — PRENATAL MULTIVITAMIN CH
1.0000 | ORAL_TABLET | Freq: Every day | ORAL | Status: DC
  Administered 2017-11-04 – 2017-11-06 (×3): 1 via ORAL
  Filled 2017-11-04 (×3): qty 1

## 2017-11-04 MED ORDER — IBUPROFEN 600 MG PO TABS
600.0000 mg | ORAL_TABLET | Freq: Four times a day (QID) | ORAL | Status: DC
  Administered 2017-11-04 – 2017-11-06 (×9): 600 mg via ORAL
  Filled 2017-11-04 (×9): qty 1

## 2017-11-04 MED ORDER — METHYLERGONOVINE MALEATE 0.2 MG/ML IJ SOLN
0.2000 mg | Freq: Once | INTRAMUSCULAR | Status: AC
  Administered 2017-11-04: 0.2 mg via INTRAMUSCULAR

## 2017-11-04 MED ORDER — SIMETHICONE 80 MG PO CHEW: 80.0000 mg | CHEWABLE_TABLET | ORAL | Status: DC | PRN

## 2017-11-04 MED ORDER — TETANUS-DIPHTH-ACELL PERTUSSIS 5-2.5-18.5 LF-MCG/0.5 IM SUSP: 0.5000 mL | Freq: Once | INTRAMUSCULAR | Status: DC

## 2017-11-04 MED ORDER — ONDANSETRON HCL 4 MG/2ML IJ SOLN: 4.0000 mg | INTRAMUSCULAR | Status: DC | PRN

## 2017-11-04 MED ORDER — METHYLERGONOVINE MALEATE 0.2 MG/ML IJ SOLN
INTRAMUSCULAR | Status: AC
  Filled 2017-11-04: qty 1

## 2017-11-04 MED ORDER — ZOLPIDEM TARTRATE 5 MG PO TABS: 5.0000 mg | ORAL_TABLET | Freq: Every evening | ORAL | Status: DC | PRN

## 2017-11-04 MED ORDER — ONDANSETRON HCL 4 MG PO TABS: 4.0000 mg | ORAL_TABLET | ORAL | Status: DC | PRN

## 2017-11-04 MED ORDER — SENNOSIDES-DOCUSATE SODIUM 8.6-50 MG PO TABS
2.0000 | ORAL_TABLET | ORAL | Status: DC
  Administered 2017-11-05 – 2017-11-06 (×2): 2 via ORAL
  Filled 2017-11-04 (×2): qty 2

## 2017-11-04 MED ORDER — BENZOCAINE-MENTHOL 20-0.5 % EX AERO
1.0000 "application " | INHALATION_SPRAY | CUTANEOUS | Status: DC | PRN
  Administered 2017-11-04 – 2017-11-06 (×2): 1 via TOPICAL
  Filled 2017-11-04 (×2): qty 56

## 2017-11-04 NOTE — Anesthesia Postprocedure Evaluation (Signed)
Anesthesia Post Note  Patient: Shirley MuscatBetsy Alvarez Lopez  Procedure(s) Performed: AN AD HOC LABOR EPIDURAL     Patient location during evaluation: Mother Baby Anesthesia Type: Epidural Level of consciousness: awake Pain management: pain level controlled Vital Signs Assessment: post-procedure vital signs reviewed and stable Respiratory status: spontaneous breathing Cardiovascular status: stable Postop Assessment: no headache, no backache, epidural receding, patient able to bend at knees, no apparent nausea or vomiting and adequate PO intake Anesthetic complications: no    Last Vitals:  Vitals:   11/04/17 1000 11/04/17 1105  BP: 90/61 116/76  Pulse: 95 88  Resp: 20 18  Temp: 37.2 C 37.2 C  SpO2: 100% 100%    Last Pain:  Vitals:   11/04/17 1200  TempSrc:   PainSc: 3    Pain Goal: Patients Stated Pain Goal: 3 (11/04/17 1200)               Miliano Cotten

## 2017-11-04 NOTE — Progress Notes (Signed)
At bedside during prolonged deceleration   IUPC placed, FSE placed, Oxygen applied and pitocin discontinued   Cervical change from 7cm to 8cm occurred within 10 minutes- rapid cervical change and descent   Patient positioned on hands and knees   Fetus recovered to baseline after 5 minute prolong   Amnioinfusion initiated for continued variable decelerations   Sharyon CableVeronica C Danitra Payano, CNM 11/03/17

## 2017-11-04 NOTE — Consult Note (Signed)
Called to delivery due to vacuum assist for FTP by Dr. Despina HiddenEure.  Sent away after successful delivery of a well-appearing infant.    Please contact us if concerns and we will gladly return and assess.    Leary RocaEhrmann, MD Neonatology

## 2017-11-04 NOTE — Lactation Note (Signed)
This note was copied from a baby's chart. Lactation Consultation Note Baby 15 hrs old. 1st time mom. Spanish speaking, knows a little [place]. Sister at bedside interpreter for mom. Mom has cone shaped breast w/short shaft nipples. Shells given to wear between feedings. Hand pump given to pre-pump to evert short shaft nipple prior to latching. Mom trying to BF in cradle position when LC entered rm. Baby's face smashed into breast, baby popping off frequently. Has stuffy nose as well. Mom c/o painful latch.. LC demonstrated chin tug, mom stated better. When baby unlatched encouraged football. Repositioned pillows for support to mom and position. Discussed proper body alignment, latched baby w/o pain.  Baby has cephalhematoma to head, tender to touch. Discussed head support and prop while feeding. Discussed high risk for jaundice, sleepiness, decreased interest in feedings. Alert RN if occurs. Educated newborn feeding habits, behaviors, STS, I&O, cluster feeding, supply and demand.  Mom had baby swaddled in blankets. Encouraged to un-wrap for feedings. Baby skin hot to touch. Room temp. Warm. Reviewed dangers of over heating baby.  Taught breast massage and assessing for transfer after feedings. Noted significant softening of breast after feeding compared to opposite breast. Mom excited.  Heard multiple frequent swallows. Mom encouraged to feed baby 8-12 times/24 hours and with feeding cues.  Encouraged mom to call for assistance or questions.  [facility] brochure given w/resources, support groups and LC services.  Patient Name: Gloria Shirley MuscatBetsy Alvarez Kent Today's Date: 11/04/2017 Reason for consult: Initial assessment   Maternal Data Has patient been taught Hand Expression?: Yes Does the patient have breastfeeding experience prior to this delivery?: No  Feeding Feeding Type: Breast Fed Length of feed: 20 min(still BF)  LATCH Score Latch: Repeated attempts needed to sustain latch, nipple held in  mouth throughout feeding, stimulation needed to elicit sucking reflex.  Audible Swallowing: Spontaneous and intermittent  Type of Nipple: Everted at rest and after stimulation  Comfort (Breast/Nipple): Soft / non-tender  Hold (Positioning): Assistance needed to correctly position infant at breast and maintain latch.  LATCH Score: 8  Interventions Interventions: Breast feeding basics reviewed;Support pillows;Assisted with latch;Position options;Skin to skin;Breast massage;Hand express;Pre-pump if needed;Hand pump;Breast compression;Adjust position  Lactation Tools Discussed/Used Tools: Shells;Pump Shell Type: Inverted Breast pump type: Manual WIC Program: Yes Pump Review: Setup, frequency, and cleaning;Milk Storage Initiated by:: Peri JeffersonL. Dearius Hoffmann RN IBCLC Date initiated:: 11/04/17   Consult Status Consult Status: Follow-up Date: 11/04/17 Follow-up type: In-patient    Charyl DancerCARVER, [name] 11/04/2017, 11:26 PM

## 2017-11-04 NOTE — Progress Notes (Signed)
LABOR PROGRESS NOTE  BP (!) 141/86   Pulse 83   Temp 99.5 F (37.5 C) (Oral)   Resp 18   Ht 5\' 3"  (1.6 m)   Wt 73.5 kg   BMI 28.70 kg/m  or  Vitals:   11/04/17 0031 11/04/17 0101 11/04/17 0132 11/04/17 0202  BP: 118/65 131/72 130/64 (!) 141/86  Pulse: 78 78 75 83  Resp: 18 18 18 18   Temp:      TempSrc:      Weight:      Height:        Patient started pushing at 0220 after laboring down for 1 hour  Dilation: 10 Dilation Complete Date: 11/04/17 Dilation Complete Time: 0049 Effacement (%): 100 Cervical Position: Middle Station: 0 Presentation: Vertex Exam by:: Gwendolyn GrantMadison Hicks, RN  FHT: baseline rate 155, moderate varibility, +acel, variable decel Toco: 2-3  Labor: Pushing  Fetal Wellbeing:  Cat II Pain Control:  Epidural  Anticipated MOD:  SVD  Sharyon CableRogers, Vonna Brabson C, CNM 11/04/2017, 2:36 AM

## 2017-11-05 NOTE — Progress Notes (Signed)
POSTPARTUM PROGRESS NOTE  Post Partum Day 1 Subjective:  Gloria Kent is a 27 y.o. G1P1001 8443w0d s/p VAVD after IOL for bleeding with history of previa that resolved at [redacted] wk GA.  No acute events overnight.  Pt denies problems with ambulating, voiding or po intake.  She denies nausea or vomiting.  Pain is moderately controlled (she reports 6 or 7 out of 10), controlled with motrin. Lochia Moderate.   Objective: Blood pressure (!) 98/57, pulse 86, temperature 97.8 F (36.6 C), temperature source Oral, resp. rate 18, height 5\' 3"  (1.6 m), weight 73.5 kg, SpO2 100 %  Physical Exam:  General: alert, cooperative and no distress Lochia:normal flow Chest: no respiratory distress Heart:regular rate, distal pulses intact Abdomen: soft, nontender,  Uterine Fundus: firm, appropriately tender DVT Evaluation: No calf swelling or tenderness Extremities: no edema  Recent Labs    11/03/17 1045  HGB 10.0*  HCT 34.1*    Assessment/Plan:  ASSESSMENT: Gloria Kent is a 27 y.o. G1P1001 4743w0d s/p VAVD after IOL for bleeding history (previa).   - Contraception: Depo at postpartum visit - Feeding: breast - Pain: patient wishes to only take motrin for pain control   LOS: 3 days   Raynelle HighlandJuliana W Stone MS3 11/05/2017, 7:42 AM   OB FELLOW MEDICAL STUDENT NOTE ATTESTATION  I confirm that I have verified the information documented in the medical student's note and that I have also personally performed the physical exam and all medical decision making activities.   Gloria Kent is 27 y.o. 41P1001 female PPD #1 from SVD with history of IOL for bleeding with h/o previa that resolved at [redacted]wk GA. Patient reports feeling well. Ambulating without difficulty, voiding normally, lochia is mild, pain is well controlled. Vital signs stable. Physical exam benign with firm uterine fundus and no LE edema. Patient is breast feeding. Plans for Depo for contraception. Circ N/A. Plan for d/c tomorrow.    Marcy Sirenatherine Wallace, D.O. OB Fellow  11/05/2017, 8:24 PM

## 2017-11-05 NOTE — Lactation Note (Signed)
This note was copied from a baby's chart. Lactation Consultation Note  Patient Name: Gloria Kent Today's Date: 11/05/2017   [facility]  Previous LC asked mother to call when baby was interested in feeding to watch/assist with latching.  Since mother has not yet called I went to follow up.  She had visitors and stated she forgot to call.  I reviewed how to latch effectively and tips for a good deep latch; highly encouraged mother to call the next time the baby shows cues so mother can be certain she is latching appropriately.  She can call an RN/LC any time for assistance.  Mother verbalized understanding.             [name] R [name] 11/05/2017, 6:59 PM

## 2017-11-06 MED ORDER — IBUPROFEN 600 MG PO TABS: 600.0000 mg | ORAL_TABLET | Freq: Four times a day (QID) | ORAL | 0 refills | Status: DC

## 2017-11-06 MED ORDER — WITCH HAZEL-GLYCERIN EX PADS: 1.0000 "application " | MEDICATED_PAD | CUTANEOUS | 12 refills | Status: DC | PRN

## 2017-11-06 MED ORDER — BENZOCAINE-MENTHOL 20-0.5 % EX AERO: 1.0000 "application " | INHALATION_SPRAY | CUTANEOUS | 0 refills | Status: DC | PRN

## 2017-11-06 MED ORDER — ACETAMINOPHEN 325 MG PO TABS: 650.0000 mg | ORAL_TABLET | ORAL | 0 refills | Status: DC | PRN

## 2017-11-06 NOTE — Discharge Summary (Signed)
OB Discharge Summary     Patient Name: Gloria Kent DOB: Sep 30, 1990 MRN: [id number]  Date of admission: 11/02/2017 Delivering MD: Lazaro Arms   Date of discharge: 11/06/2017  Admitting diagnosis: 40 wks bleeding Intrauterine pregnancy: [redacted]w[redacted]d     Secondary diagnosis:  Active Problems:   Post term pregnancy over 40 weeks   Vacuum extractor delivery, delivered  Additional problems: hx of previa during this pregnancy (resolved) GBS pos     Discharge diagnosis: Term Pregnancy Delivered    VAVD                                                                                 Post partum procedures:.  Augmentation: Pitocin  Complications: None  Hospital course:  Induction of Labor With Vaginal Delivery   27 y.o. yo G1P1001 at [redacted]w[redacted]d was admitted to the hospital 11/02/2017 for induction of labor.  Indication for induction: bleeding.  Patient had an uncomplicated labor course as follows: Membrane Rupture Time/Date: 10:17 AM ,11/03/2017   Intrapartum Procedures: Episiotomy: None [1]                                         Lacerations:  3rd degree [4]  Patient had delivery of a Viable infant.  Information for the patient's newborn:  Gloria Kent, Girl Gloria Kent [[id number]]      11/04/2017  Details of delivery can be found in separate delivery note.  Patient had a routine postpartum course. Patient is discharged home 11/06/17.  Physical exam  Vitals:   11/05/17 0642 11/05/17 1431 11/05/17 2325 11/06/17 0559  BP: (!) 98/57 108/72 107/68 117/75  Pulse: 86 94 (!) 106 (!) 104  Resp: 18 18 18 20   Temp: 97.8 F (36.6 C) 97.9 F (36.6 C) 97.7 F (36.5 C) 97.7 F (36.5 C)  TempSrc: Oral Oral Oral Oral  SpO2:  100%    Weight:      Height:       General: alert, cooperative and no distress Lochia: appropriate Uterine Fundus: firm Incision: Healing well with no significant drainage DVT Evaluation: No evidence of DVT seen on physical exam. Labs: Lab Results  Component Value  Date   WBC 9.5 11/03/2017   HGB 10.0 (L) 11/03/2017   HCT 34.1 (L) 11/03/2017   MCV 70.7 (L) 11/03/2017   PLT 249 11/03/2017   CMP Latest Ref Rng & Units 10/10/2011  Glucose 70 - 99 mg/dL 90  BUN 6 - 23 mg/dL 7  Creatinine 7.82 - 9.56 mg/dL 2.13(Y)  Sodium 865 - 784 mEq/L 136  Potassium 3.5 - 5.1 mEq/L 4.0  Chloride 96 - 112 mEq/L 98  CO2 19 - 32 mEq/L 25  Calcium 8.4 - 10.5 mg/dL 9.0  Total Protein 6.0 - 8.3 g/dL -  Total Bilirubin 0.3 - 1.2 mg/dL -  Alkaline Phos 39 - 696 U/L -  AST 0 - 37 U/L -  ALT 0 - 35 U/L -    Discharge instruction: per After Visit Summary and "Baby and Me Booklet".  After visit meds:  Allergies as of 11/06/2017  No Known Allergies     Medication List    TAKE these medications   acetaminophen 325 MG tablet Commonly known as:  TYLENOL Take 2 tablets (650 mg total) by mouth every 4 (four) hours as needed (for pain scale < 4).   benzocaine-Menthol 20-0.5 % Aero Commonly known as:  DERMOPLAST Apply 1 application topically as needed for irritation (perineal discomfort).   ibuprofen 600 MG tablet Commonly known as:  ADVIL,MOTRIN Take 1 tablet (600 mg total) by mouth every 6 (six) hours.   prenatal multivitamin Tabs tablet Take 1 tablet by mouth daily at 12 noon.   witch hazel-glycerin pad Commonly known as:  TUCKS Apply 1 application topically as needed for hemorrhoids.       Diet: routine diet  Activity: Advance as tolerated. Pelvic rest for 6 weeks.   Outpatient follow up:2 weeks Follow up Appt:No future appointments. Patient will schedule with HD Follow up Visit:No follow-ups on file.  Postpartum contraception: Depo Provera  Newborn Data: Live born female  Birth Weight: 8 lb 5.5 oz (3785 g) APGAR: 8, 9  Newborn Delivery   Birth date/time:  11/04/2017 07:52:00 Delivery type:  Vaginal, Vacuum (Extractor)     Baby Feeding: Breast Disposition:home with mother   11/06/2017 Gloria RollingScott Naome Brigandi, DO

## 2017-11-06 NOTE — Lactation Note (Signed)
This note was copied from a baby's chart. Lactation Consultation Note  Patient Name: Gloria Kent WUJWJ'XShirley Muscatoday's Date: 11/06/2017 Reason for consult: Follow-up assessment;Infant weight loss;Hyperbilirubinemia;Nipple pain/trauma Baby is 4854 hours old As LC entered the room, mom attempting to latch the abby and the baby fussy and on an off. LC suspect its due to the baby having so many bottles.  LC offered 66F SNS with latching, and mom receptive. LC showed mom and dad how the 66F SNS works and showed add how to insert it in the side of the mouth after the baby is latched. The baby fed for 20 mins and took 17 ml from the SNS, and was satisfied.  LC reminded mom the baby needs practice latching and the SNS would be useful to give the  Mom stimulation to bring the milk in.   Maternal Data    Feeding Feeding Type: Formula Length of feed: 20 min  LATCH Score Latch: Grasps breast easily, tongue down, lips flanged, rhythmical sucking.  Audible Swallowing: Spontaneous and intermittent(with SNS )  Type of Nipple: Everted at rest and after stimulation  Comfort (Breast/Nipple): Soft / non-tender  Hold (Positioning): Assistance needed to correctly position infant at breast and maintain latch.  LATCH Score: 9  Interventions Interventions: Breast feeding basics reviewed;Assisted with latch;Skin to skin;Breast massage;Hand express;Pre-pump if needed;Breast compression;Adjust position;Support pillows;Position options;Expressed milk;Shells;Hand pump;Comfort gels  Lactation Tools Discussed/Used Tools: Shells;Pump;Comfort gels;66F feeding tube / Syringe Shell Type: Inverted Breast pump type: Manual   Consult Status Consult Status: Complete Date: 11/06/17    Kathrin GreathouseMargaret Ann Deidrea Gaetz 11/06/2017, 2:42 PM

## 2017-12-14 ENCOUNTER — Encounter (HOSPITAL_COMMUNITY): Payer: Self-pay

## 2017-12-14 ENCOUNTER — Emergency Department (HOSPITAL_COMMUNITY)
Admission: EM | Admit: 2017-12-14 | Discharge: 2017-12-14 | Disposition: A | Discharge status: Home / Self Care | Payer: Medicaid Other | Attending: Emergency Medicine | Admitting: Emergency Medicine

## 2017-12-14 DIAGNOSIS — Z79899 Other long term (current) drug therapy: Secondary | ICD-10-CM | POA: Diagnosis not present

## 2017-12-14 DIAGNOSIS — M25532 Pain in left wrist: Secondary | ICD-10-CM

## 2017-12-14 NOTE — ED Notes (Signed)
Family at bedside. 

## 2017-12-14 NOTE — ED Provider Notes (Signed)
[facility] EMERGENCY DEPARTMENT Provider Note   CSN: [id number] Arrival date & time: 12/14/17  1118     History   Chief Complaint Chief Complaint  Patient presents with  . Wrist Pain    HPI Gloria Kent is a 27 y.o. female presenting to the emergency department with complaint of 1 month of left wrist pain.  She recently gave birth one month ago and had an IV located in this area.  The pain is on the radial aspect of the left wrist just proximal to the hand.  Pain is worse when she adducts her thumb, or grasping objects.  She denies redness or swelling.  Denies history of blood clot.  Pain does not extend into her forearm or upper arm.  The history is provided by the patient.    Past Medical History:  Diagnosis Date  . Dysrhythmia 10/02/11   "lately heart is beating faster"  . Headache(784.0) 10/02/11   "weekly"  . Pleural effusion 10/02/11  . TB (pulmonary tuberculosis) questionable   hospitalized in 2014 treated until negative    Patient Active Problem List   Diagnosis Date Noted  . Vacuum extractor delivery, delivered 11/04/2017  . Post term pregnancy over 40 weeks 11/02/2017  . 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 Microcytic anemia 10/02/2011  . Sinus tachycardia 10/02/2011  . Exudative pleural effusion 10/02/2011  . Protein malnutrition (HCC) 10/02/2011    Past Surgical History:  Procedure Laterality Date  . NO PAST SURGERIES    . PLEURAL EFFUSION DRAINAGE  10/04/2011   Procedure: DRAINAGE OF PLEURAL EFFUSION;  Surgeon: Alleen Borne, MD;  Location: [facility] OR;  Service: Thoracic;  Laterality: Left;     OB History    Gravida  1   Para  1   Term  1   Preterm      AB      Living  1     SAB      TAB      Ectopic      Multiple  0   Live Births  1            Home Medications    Prior to Admission medications   Medication Sig Start Date End Date Taking? Authorizing Provider  acetaminophen (TYLENOL) 325 MG tablet Take 2  tablets (650 mg total) by mouth every 4 (four) hours as needed (for pain scale < 4). 11/06/17   [name], DO  benzocaine-Menthol (DERMOPLAST) 20-0.5 % AERO Apply 1 application topically as needed for irritation (perineal discomfort). 11/06/17   [name], DO  ibuprofen (ADVIL,MOTRIN) 600 MG tablet Take 1 tablet (600 mg total) by mouth every 6 (six) hours. 11/06/17   [name], DO  Prenatal Vit-Fe Fumarate-FA (PRENATAL MULTIVITAMIN) TABS tablet Take 1 tablet by mouth daily at 12 noon.    [provider]  witch hazel-glycerin (TUCKS) pad Apply 1 application topically as needed for hemorrhoids. 11/06/17   [name], DO    Family History Family History  Problem Relation Age of Onset  . Hypertension Maternal Grandmother     Social History Social History   Tobacco Use  . Smoking status: Never Smoker  . Smokeless tobacco: Never Used  Substance Use Topics  . Alcohol use: No  . Drug use: No     Allergies   Patient has no known allergies.   Review of Systems Review of Systems  Musculoskeletal: Positive for arthralgias. Negative for joint swelling.  Skin: Negative for color change.  Physical Exam Updated Vital Signs BP 107/68 (BP Location: Right Arm)   Pulse 87   Temp 98.8 F (37.1 C) (Oral)   Resp 16   SpO2 100%   Physical Exam  Constitutional: She appears well-developed and well-nourished.  HENT:  Head: Normocephalic and atraumatic.  Eyes: Conjunctivae are normal.  Cardiovascular: Normal rate and intact distal pulses.  Pulmonary/Chest: Effort normal.  Musculoskeletal:  There is tenderness to the radial aspect of the left wrist just proximal to the anatomical snuffbox.  positive Finkelstein test.  Wrist is otherwise with normal range of motion though with some pain on inversion.  There is no erythema, no warmth, no swelling.  There is no tenderness to the forearm or upper arm.  Tenderness is localized on the abductors of the thumb.  Normal sensation.    Psychiatric: She has a normal mood and affect. Her behavior is normal.  Nursing note and vitals reviewed.    ED Treatments / Results  Labs (all labs ordered are listed, but only abnormal results are displayed) Labs Reviewed - No data to display  EKG None  Radiology No results found.  Procedures Procedures (including critical care time)  Medications Ordered in ED Medications - No data to display   Initial Impression / Assessment and Plan / ED Course  I have reviewed the triage vital signs and the nursing notes.  Pertinent labs & imaging results that were available during my care of the patient were reviewed by me and considered in my medical decision making (see chart for details).    Patient with presentation and symptoms consistent with de Quervain's tendinitis.  Patient did have IV in this area 1 month ago during the birth of her child however there is no evidence on exam of phlebitis/thrombophlebitis/thrombosis.  Pain is focal, and remainder of arm is nontender.  There is no erythema, swelling, or warmth to suggest clot or phlebitis.  Will treat as tendinitis with thumb spica, ice, NSAIDs.  Hand referral provided as needed.  PCP referral provided as patient does not have insurance.  Discussed with Dr. Jacqulyn Bath, who guided treatment and agrees with care plan.  Safe for discharge.  Discussed results, findings, treatment and follow up. Patient advised of return precautions. Patient verbalized understanding and agreed with plan.   Final Clinical Impressions(s) / ED Diagnoses   Final diagnoses:  Acute pain of left wrist    ED Discharge Orders    None       Avion Patella, [place] N, PA-C 12/14/17 1249    Maia Plan, MD 12/14/17 1944

## 2017-12-14 NOTE — ED Notes (Signed)
Patient is alert and orientedx4.  Patient was explained discharge instructions and they understood them with no questions.  The patient's husband, Real Cons is taking the patient home.

## 2017-12-14 NOTE — ED Triage Notes (Signed)
Patient complains of left wrist/hand pain x 1 month since having IV in for birth, no swelling, no redness

## 2017-12-14 NOTE — Discharge Instructions (Signed)
Please read instructions below. Apply ice to your wrist for 20 minutes at a time. You can take ibuprofen every 6 hours as needed for pain. You can take acetaminophen/tylenol as well. Schedule an appointment with the hand specialist in 1-2 weeks if symptoms persist. Establish primary care.   Return to the ER for new or concerning symptoms.

## 2019-11-23 ENCOUNTER — Ambulatory Visit: Admission: EM | Admit: 2019-11-23 | Discharge: 2019-11-23 | Discharge status: Against Medical Advice | Payer: Self-pay

## 2020-01-12 ENCOUNTER — Ambulatory Visit: Payer: Self-pay | Admitting: Orthopaedic Surgery

## 2020-02-23 ENCOUNTER — Telehealth: Payer: Self-pay | Admitting: Orthopaedic Surgery

## 2020-02-23 NOTE — Telephone Encounter (Signed)
Received S?T disability paperwork/forwarding to [employer] today

## 2020-04-16 ENCOUNTER — Emergency Department (HOSPITAL_COMMUNITY)
Admission: EM | Admit: 2020-04-16 | Discharge: 2020-04-17 | Disposition: A | Discharge status: Home / Self Care | Payer: Medicaid Other | Attending: Emergency Medicine | Admitting: Emergency Medicine

## 2020-04-16 ENCOUNTER — Other Ambulatory Visit: Payer: Self-pay

## 2020-04-16 DIAGNOSIS — N739 Female pelvic inflammatory disease, unspecified: Secondary | ICD-10-CM | POA: Diagnosis not present

## 2020-04-16 DIAGNOSIS — N730 Acute parametritis and pelvic cellulitis: Secondary | ICD-10-CM

## 2020-04-16 DIAGNOSIS — R1031 Right lower quadrant pain: Secondary | ICD-10-CM

## 2020-04-16 LAB — I-STAT BETA HCG BLOOD, ED (MC, WL, AP ONLY): I-stat hCG, quantitative: <5 m[IU]/mL (ref <5)

## 2020-04-16 LAB — COMPREHENSIVE METABOLIC PANEL
ALT: 46 U/L — ABNORMAL HIGH (ref 0–44)
AST: 34 U/L (ref 15–41)
Albumin: 3.8 g/dL (ref 3.5–5.0)
Alkaline Phosphatase: 68 U/L (ref 38–126)
Anion gap: 8 (ref 5–15)
BUN: <5 mg/dL — ABNORMAL LOW (ref 6–20)
CO2: 22 mmol/L (ref 22–32)
Calcium: 8.9 mg/dL (ref 8.9–10.3)
Chloride: 105 mmol/L (ref 98–111)
Creatinine, Ser: 0.44 mg/dL (ref 0.44–1.00)
GFR, Estimated: >60 mL/min (ref >60)
Glucose, Bld: 109 mg/dL — ABNORMAL HIGH (ref 70–99)
Potassium: 3.2 mmol/L — ABNORMAL LOW (ref 3.5–5.1)
Sodium: 135 mmol/L (ref 135–145)
Total Bilirubin: 0.6 mg/dL (ref 0.3–1.2)
Total Protein: 7.5 g/dL (ref 6.5–8.1)

## 2020-04-16 LAB — URINALYSIS, ROUTINE W REFLEX MICROSCOPIC
Bilirubin Urine: NEGATIVE
Glucose, UA: NEGATIVE mg/dL
Hgb urine dipstick: NEGATIVE
Ketones, ur: NEGATIVE mg/dL
Nitrite: NEGATIVE
Protein, ur: NEGATIVE mg/dL
Specific Gravity, Urine: 1.011 (ref 1.005–1.030)
pH: 6 (ref 5.0–8.0)

## 2020-04-16 LAB — CBC
HCT: 30.6 % — ABNORMAL LOW (ref 36.0–46.0)
Hemoglobin: 8.3 g/dL — ABNORMAL LOW (ref 12.0–15.0)
MCH: 17 pg — ABNORMAL LOW (ref 26.0–34.0)
MCHC: 27.1 g/dL — ABNORMAL LOW (ref 30.0–36.0)
MCV: 62.8 fL — ABNORMAL LOW (ref 80.0–100.0)
Platelets: 358 10*3/uL (ref 150–400)
RBC: 4.87 MIL/uL (ref 3.87–5.11)
RDW: 18.5 % — ABNORMAL HIGH (ref 11.5–15.5)
WBC: 6.1 10*3/uL (ref 4.0–10.5)
nRBC: 0 % (ref 0.0–0.2)

## 2020-04-16 LAB — LIPASE, BLOOD: Lipase: 31 U/L (ref 11–51)

## 2020-04-16 NOTE — ED Triage Notes (Signed)
Pt presents to ED POV. Pt c/o suprapubic pain and nausea. Pt reports that pain began 2w ago and has been intermittent. LMP 3w ago. Pt took home pregnancy test at home and it was negative. Pt reports she had similar pain years ago and she was told she had a cyst on her ovaries. Denies any GU s/s, reports normal bowel movements

## 2020-04-17 ENCOUNTER — Emergency Department (HOSPITAL_COMMUNITY): Payer: Medicaid Other

## 2020-04-17 LAB — WET PREP, GENITAL
Clue Cells Wet Prep HPF POC: NONE SEEN
Sperm: NONE SEEN
Trich, Wet Prep: NONE SEEN
Yeast Wet Prep HPF POC: NONE SEEN

## 2020-04-17 MED ORDER — DOXYCYCLINE HYCLATE 100 MG PO CAPS: 100.0000 mg | ORAL_CAPSULE | Freq: Two times a day (BID) | ORAL | 0 refills | Status: AC

## 2020-04-17 MED ORDER — METRONIDAZOLE 500 MG PO TABS: 500.0000 mg | ORAL_TABLET | Freq: Two times a day (BID) | ORAL | 0 refills | Status: DC

## 2020-04-17 MED ORDER — IOHEXOL 300 MG/ML  SOLN
100.0000 mL | Freq: Once | INTRAMUSCULAR | Status: AC | PRN
  Administered 2020-04-17: 100 mL via INTRAVENOUS

## 2020-04-17 MED ORDER — MORPHINE SULFATE (PF) 4 MG/ML IV SOLN
4.0000 mg | Freq: Once | INTRAVENOUS | Status: AC
  Administered 2020-04-17: 4 mg via INTRAVENOUS
  Filled 2020-04-17: qty 1

## 2020-04-17 MED ORDER — ONDANSETRON HCL 4 MG/2ML IJ SOLN
4.0000 mg | Freq: Once | INTRAMUSCULAR | Status: AC
  Administered 2020-04-17: 4 mg via INTRAVENOUS
  Filled 2020-04-17: qty 2

## 2020-04-17 MED ORDER — ONDANSETRON 4 MG PO TBDP: 4.0000 mg | ORAL_TABLET | Freq: Three times a day (TID) | ORAL | 0 refills | Status: DC | PRN

## 2020-04-17 MED ORDER — SODIUM CHLORIDE 0.9 % IV BOLUS
1000.0000 mL | Freq: Once | INTRAVENOUS | Status: AC
  Administered 2020-04-17: 1000 mL via INTRAVENOUS

## 2020-04-17 MED ORDER — CEFTRIAXONE SODIUM 500 MG IJ SOLR
500.0000 mg | Freq: Once | INTRAMUSCULAR | Status: AC
  Administered 2020-04-17: 500 mg via INTRAMUSCULAR
  Filled 2020-04-17: qty 500

## 2020-04-17 MED ORDER — DOXYCYCLINE HYCLATE 100 MG PO TABS
100.0000 mg | ORAL_TABLET | Freq: Once | ORAL | Status: AC
  Administered 2020-04-17: 100 mg via ORAL
  Filled 2020-04-17: qty 1

## 2020-04-17 MED ORDER — STERILE WATER FOR INJECTION IJ SOLN
INTRAMUSCULAR | Status: AC
  Filled 2020-04-17: qty 10

## 2020-04-17 NOTE — Discharge Instructions (Addendum)
Tome los medicamentos segn lo prescrito.  Haga un seguimiento con el mdico obstetra y Research scientist (physical sciences) en su documentacin de alta o, si ya tiene un obstetra y Research scientist (physical sciences), puede hacer un seguimiento con ellos.  Regresar por sntomas nuevos o que empeoran

## 2020-04-17 NOTE — ED Notes (Signed)
Pt's IV removed, per Dahlia Client - RN.

## 2020-04-17 NOTE — ED Provider Notes (Signed)
[facility] EMERGENCY DEPARTMENT Provider Note   CSN: [id number] Arrival date & time: 04/16/20  1919    History Chief Complaint  Patient presents with  . Abdominal Pain    Gloria Kent is a 30 y.o. female with past medical history significant for TB, anemia secondary to heavy menstrual cycles who presents for evaluation of abdominal pain. Patient states 1/2 weeks ago she developed generalized abdominal pain. Now located to her entire lower abdomen however worse to her right lower quadrant. States she has had lower abdominal pain which she told she had ovarian cyst. Initially had some nausea with NBNB emesis however symptoms have significantly improved. She does rate her current pain a 8/10. Had 2 episodes of diarrhea without melena or bright blood per rectum last week however has had "normal" bowel movement since. She is unsure of her last BM. She is sexually active, has clear vaginal discharge. No prior history of STDs. Denies chance of pregnancy. Denies fever, chills, lightheadedness, dizziness pain or eating, movement. Does admit to chronic heavy menstrual cycles, no increase in bleeding. Unsure of LMP. Denies additional aggravating or alleviating factors.  History obtained from patient and past medical records. No interpreter used  HPI     Past Medical History:  Diagnosis Date  . Dysrhythmia 10/02/11   "lately heart is beating faster"  . Headache(784.0) 10/02/11   "weekly"  . Pleural effusion 10/02/11  . TB (pulmonary tuberculosis) questionable   hospitalized in 2014 treated until negative    Patient Active Problem List   Diagnosis Date Noted  . Vacuum extractor delivery, delivered 11/04/2017  . Post term pregnancy over 40 weeks 11/02/2017  . 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 Microcytic anemia 10/02/2011  . Sinus tachycardia 10/02/2011  . Exudative pleural effusion 10/02/2011  . Protein malnutrition (HCC) 10/02/2011    Past Surgical History:   Procedure Laterality Date  . NO PAST SURGERIES    . PLEURAL EFFUSION DRAINAGE  10/04/2011   Procedure: DRAINAGE OF PLEURAL EFFUSION;  Surgeon: Alleen Borne, MD;  Location: [facility] OR;  Service: Thoracic;  Laterality: Left;     OB History    Gravida  1   Para  1   Term  1   Preterm      AB      Living  1     SAB      IAB      Ectopic      Multiple  0   Live Births  1           Family History  Problem Relation Age of Onset  . Hypertension Maternal Grandmother     Social History   Tobacco Use  . Smoking status: Never Smoker  . Smokeless tobacco: Never Used  Substance Use Topics  . Alcohol use: No  . Drug use: No    Home Medications Prior to Admission medications   Medication Sig Start Date End Date Taking? Authorizing Provider  doxycycline (VIBRAMYCIN) 100 MG capsule Take 1 capsule (100 mg total) by mouth 2 (two) times daily for 14 days. 04/17/20 05/01/20 Yes Diona Peregoy A, PA-C  metroNIDAZOLE (FLAGYL) 500 MG tablet Take 1 tablet (500 mg total) by mouth 2 (two) times daily. 04/17/20  Yes Nyiesha Beever A, PA-C  ondansetron (ZOFRAN ODT) 4 MG disintegrating tablet Take 1 tablet (4 mg total) by mouth every 8 (eight) hours as needed for nausea or vomiting. 04/17/20  Yes Aleenah Homen A, PA-C  acetaminophen (TYLENOL) 325 MG  tablet Take 2 tablets (650 mg total) by mouth every 4 (four) hours as needed (for pain scale < 4). 11/06/17   [name], DO  benzocaine-Menthol (DERMOPLAST) 20-0.5 % AERO Apply 1 application topically as needed for irritation (perineal discomfort). 11/06/17   [name], DO  ibuprofen (ADVIL,MOTRIN) 600 MG tablet Take 1 tablet (600 mg total) by mouth every 6 (six) hours. 11/06/17   [name], DO  Prenatal Vit-Fe Fumarate-FA (PRENATAL MULTIVITAMIN) TABS tablet Take 1 tablet by mouth daily at 12 noon.    [provider]  witch hazel-glycerin (TUCKS) pad Apply 1 application topically as needed for hemorrhoids. 11/06/17   [name],  DO    Allergies    Patient has no known allergies.  Review of Systems   Review of Systems  Constitutional: Negative.   HENT: Negative.   Respiratory: Negative.   Cardiovascular: Negative.   Gastrointestinal: Positive for abdominal pain (Lower abdomen), diarrhea (Last week, resolved), nausea and vomiting (Resolved). Negative for anal bleeding, blood in stool and constipation.  Genitourinary: Negative.   Musculoskeletal: Negative.   Skin: Negative.   Neurological: Negative.   All other systems reviewed and are negative.   Physical Exam Updated Vital Signs BP 109/72 (BP Location: Right Arm)   Pulse 83   Temp 97.6 F (36.4 C) (Oral)   Resp 17   SpO2 99%   Physical Exam Vitals and nursing note reviewed.  Constitutional:      General: She is not in acute distress.    Appearance: She is well-developed and well-nourished. She is not ill-appearing, toxic-appearing or diaphoretic.  HENT:     Head: Normocephalic and atraumatic.     Mouth/Throat:     Mouth: Mucous membranes are moist.  Eyes:     Pupils: Pupils are equal, round, and reactive to light.  Cardiovascular:     Rate and Rhythm: Normal rate.     Pulses: Intact distal pulses.     Heart sounds: Normal heart sounds.  Pulmonary:     Effort: Pulmonary effort is normal. No respiratory distress.     Breath sounds: Normal breath sounds.     Comments: Speaks in full sentences without difficulty Abdominal:     General: There is no distension.     Palpations: Abdomen is soft.     Tenderness: There is generalized abdominal tenderness and tenderness in the right lower quadrant, suprapubic area and left lower quadrant. There is no right CVA tenderness, left CVA tenderness, guarding or rebound. Negative signs include Murphy's sign, psoas sign and obturator sign.     Hernia: No hernia is present.     Comments: Generalized abdominal pain however worse to lower quadrants, right greater than left  Genitourinary:    Comments: Normal  appearing external female genitalia without rashes or lesions, normal vaginal epithelium. Normal appearing cervix. Moderate yellow discharge. No cervical petechiae. Cervical os is closed. There is no bleeding noted at the os. No odor. Bimanual:Mild CMT tenderness. No palpable adnexal masses or tenderness. Uterus midline and not fixed. Rectovaginal exam was deferred.  No cystocele or rectocele noted. No pelvic lymphadenopathy noted. Wet prep was obtained.  Cultures for gonorrhea and chlamydia collected. Exam performed with chaperone in room. Musculoskeletal:        General: Normal range of motion.     Cervical back: Normal range of motion.  Skin:    General: Skin is warm and dry.     Capillary Refill: Capillary refill takes less than 2 seconds.  Comments: No rashes or lesions  Neurological:     General: No focal deficit present.     Mental Status: She is alert and oriented to person, place, and time.     Comments: Cranial nerves II through XII grossly intact Ambulatory with out difficulty  Psychiatric:        Mood and Affect: Mood and affect normal.     ED Results / Procedures / Treatments   Labs (all labs ordered are listed, but only abnormal results are displayed) Labs Reviewed  WET PREP, GENITAL - Abnormal; Notable for the following components:      Result Value   WBC, Wet Prep HPF POC MODERATE (*)    All other components within normal limits  COMPREHENSIVE METABOLIC PANEL - Abnormal; Notable for the following components:   Potassium 3.2 (*)    Glucose, Bld 109 (*)    BUN <5 (*)    ALT 46 (*)    All other components within normal limits  CBC - Abnormal; Notable for the following components:   Hemoglobin 8.3 (*)    HCT 30.6 (*)    MCV 62.8 (*)    MCH 17.0 (*)    MCHC 27.1 (*)    RDW 18.5 (*)    All other components within normal limits  URINALYSIS, ROUTINE W REFLEX MICROSCOPIC - Abnormal; Notable for the following components:   APPearance HAZY (*)    Leukocytes,Ua LARGE  (*)    Bacteria, UA RARE (*)    All other components within normal limits  URINE CULTURE  LIPASE, BLOOD  I-STAT BETA HCG BLOOD, ED ([facility], [facility], [facility] ONLY)  GC/CHLAMYDIA PROBE AMP ([place]) NOT [facility]    EKG None  Radiology CT Abdomen Pelvis W Contrast  Result Date: 04/17/2020 CLINICAL DATA:  Right lower quadrant abdominal pelvic pain. EXAM: CT ABDOMEN AND PELVIS WITH CONTRAST TECHNIQUE: Multidetector CT imaging of the abdomen and pelvis was performed using the standard protocol following bolus administration of intravenous contrast. CONTRAST:  100mL OMNIPAQUE IOHEXOL 300 MG/ML  SOLN COMPARISON:  None. FINDINGS: Lower chest: Partially visualized airspace consolidation in the lingula. Hepatobiliary: Diffuse hepatic steatosis.  Normal gallbladder. Pancreas: Unremarkable. No pancreatic ductal dilatation or surrounding inflammatory changes. Spleen: Normal in size without focal abnormality. Adrenals/Urinary Tract: Adrenal glands are unremarkable. Kidneys are normal, without renal calculi, focal lesion, or hydronephrosis. Bladder is unremarkable. Stomach/Bowel: Stomach is within normal limits. Appendix appears normal. No evidence of bowel wall thickening, distention, or inflammatory changes. Vascular/Lymphatic: No significant vascular findings are present. No enlarged abdominal or pelvic lymph nodes. Reproductive: Uterus and bilateral adnexa are unremarkable. Other: No abdominal wall hernia or abnormality. No abdominopelvic ascites. Musculoskeletal: No acute or significant osseous findings. IMPRESSION: 1. No evidence of acute abnormalities within the abdomen or pelvis. 2. Diffuse hepatic steatosis. 3. Partially visualized airspace consolidation in the lingula. Please correlate to patient's symptoms. Electronically Signed   By: Ted Mcalpineobrinka  Dimitrova M.D.   On: 04/17/2020 09:43   US PELVIC COMPLETE W TRANSVAGINAL AND TORSION R/O  Result Date: 04/17/2020 CLINICAL DATA:  Right lower quadrant abdominal pain. EXAM:  TRANSABDOMINAL AND TRANSVAGINAL ULTRASOUND OF PELVIS DOPPLER ULTRASOUND OF OVARIES TECHNIQUE: Both transabdominal and transvaginal ultrasound examinations of the pelvis were performed. Transabdominal technique was performed for global imaging of the pelvis including uterus, ovaries, adnexal regions, and pelvic cul-de-sac. It was necessary to proceed with endovaginal exam following the transabdominal exam to visualize the adnexa. Color and duplex Doppler ultrasound was utilized to evaluate blood flow to the  ovaries. COMPARISON:  CT of the abdomen and pelvis with contrast 04/17/2020 FINDINGS: Uterus Measurements: 7.5 x 3.4 x 3.3 cm = volume: 44 mL. No fibroids or other mass visualized. Endometrium Thickness: 14 mm.  No focal abnormality visualized. Right ovary Measurements: 3.6 x 1.6 x 2.2 cm = volume: 6.6 mL. Normal appearance/no adnexal mass. Left ovary Measurements: 3.2 x 1.5 x 2.5 cm = volume: 6.2 mL. Dominant follicle measures up to 1.9 cm, within normal limits. Pulsed Doppler evaluation of both ovaries demonstrates normal low-resistance arterial and venous waveforms. Other findings Trace free fluid is likely physiologic. IMPRESSION: 1. Normal sonographic appearance of the uterus 4 secretory phase of menstrual cycle. 2. Normal sonographic appearance of the ovaries bilaterally. Dominant follicle in the left ovary. 3. Normal color Doppler analysis of adnexal vasculature. No evidence for torsion. Electronically Signed   By: Marin Roberts M.D.   On: 04/17/2020 11:47    Procedures Procedures   Medications Ordered in ED Medications  cefTRIAXone (ROCEPHIN) injection 500 mg (has no administration in time range)  sterile water (preservative free) injection (has no administration in time range)  sodium chloride 0.9 % bolus 1,000 mL (1,000 mLs Intravenous New Bag/Given 04/17/20 0840)  ondansetron (ZOFRAN) injection 4 mg (4 mg Intravenous Given 04/17/20 0841)  morphine 4 MG/ML injection 4 mg (4 mg Intravenous  Given 04/17/20 0841)  iohexol (OMNIPAQUE) 300 MG/ML solution 100 mL (100 mLs Intravenous Contrast Given 04/17/20 0859)  doxycycline (VIBRA-TABS) tablet 100 mg (100 mg Oral Given 04/17/20 1229)    ED Course  I have reviewed the triage vital signs and the nursing notes.  Pertinent labs & imaging results that were available during my care of the patient were reviewed by me and considered in my medical decision making (see chart for details).  Unfortunately had patient with wait time of greater than 13 hours in the waiting room prior to being moved to the back to be assessed by provider.  30 year old presents for evaluation of abdominal pain. She is afebrile, nonseptic, non-ill-appearing. Initially generalized however worse to the lower quadrants, primarily right greater than left. No prior abdominal surgeries. Does have clear vaginal discharge and is sexually active however no history of STDs. Heart and lungs clear. Work-up started from triage  Labs and imaging personally reviewed and interpreted:  CBC without leukocytosis Hgb 8.3, prior 10.0, 9.5 however most recent 2 years ago CMP mild hypokalemia at 3.2, glucose 109, ALT 46, RUQ WO tenderness Preg negative UA with large leuks, bacteria, negative nitrite, Will culture Lipase 31 CT AP WO acute findings Wet Prep with may WBC [place] negative for torsion  Patient reassessed. Pain controled.Toelratint PO intake without difficulty.  GU exam with moderate healing discharge with some cervical motion tenderness.  No adnexal tenderness.  Wet prep does show many WBC.  Will treat for PID.  Discussed Symptomatic management at home and follow-up with OB/GYN.  She is agreeable for this  Patient is nontoxic, nonseptic appearing, in no apparent distress.  Patient's pain and other symptoms adequately managed in emergency department.  Fluid bolus given.  Labs, imaging and vitals reviewed.  Patient does not meet the SIRS or Sepsis criteria.  On repeat exam patient does  not have a surgical abdomin and there are no peritoneal signs.  No indication of appendicitis, bowel obstruction, bowel perforation, cholecystitis, diverticulitis, TOA, torsion or ectopic pregnancy.   The patient has been appropriately medically screened and/or stabilized in the ED. I have low suspicion for any other emergent medical condition  which would require further screening, evaluation or treatment in the ED or require inpatient management.  Patient is hemodynamically stable and in no acute distress.  Patient able to ambulate in department prior to ED.  Evaluation does not show acute pathology that would require ongoing or additional emergent interventions while in the emergency department or further inpatient treatment.  I have discussed the diagnosis with the patient and answered all questions.  Pain is been managed while in the emergency department and patient has no further complaints prior to discharge.  Patient is comfortable with plan discussed in room and is stable for discharge at this time.  I have discussed strict return precautions for returning to the emergency department.  Patient was encouraged to follow-up with PCP/specialist refer to at discharge.      MDM Rules/Calculators/A&P                           Final Clinical Impression(s) / ED Diagnoses Final diagnoses:  RLQ abdominal pain  PID (acute pelvic inflammatory disease)    Rx / DC Orders ED Discharge Orders         Ordered    doxycycline (VIBRAMYCIN) 100 MG capsule  2 times daily        04/17/20 1241    metroNIDAZOLE (FLAGYL) 500 MG tablet  2 times daily        04/17/20 1241    ondansetron (ZOFRAN ODT) 4 MG disintegrating tablet  Every 8 hours PRN        04/17/20 1242           Nohemy Koop A, PA-C 04/17/20 1247    Milagros Loll, MD 04/18/20 [phone]

## 2020-04-17 NOTE — ED Notes (Signed)
Called patient to move to room, unable to locate at this time. 

## 2020-04-18 LAB — URINE CULTURE

## 2020-04-18 LAB — GC/CHLAMYDIA PROBE AMP (~~LOC~~) NOT AT ARMC
Chlamydia: NEGATIVE
Comment: NEGATIVE
Comment: NORMAL
Neisseria Gonorrhea: NEGATIVE

## 2020-05-30 ENCOUNTER — Encounter: Payer: Self-pay | Admitting: Family Medicine

## 2020-05-30 ENCOUNTER — Other Ambulatory Visit: Payer: Self-pay

## 2020-05-30 ENCOUNTER — Ambulatory Visit (INDEPENDENT_AMBULATORY_CARE_PROVIDER_SITE_OTHER): Payer: Medicaid Other | Admitting: Nurse Practitioner

## 2020-05-30 DIAGNOSIS — Z3200 Encounter for pregnancy test, result unknown: Secondary | ICD-10-CM

## 2020-05-30 DIAGNOSIS — Z3201 Encounter for pregnancy test, result positive: Secondary | ICD-10-CM

## 2020-05-30 LAB — POCT PREGNANCY, URINE: Preg Test, Ur: POSITIVE — AB

## 2020-05-30 MED ORDER — PRENATAL 19 29-1 MG PO TABS: 1.0000 | ORAL_TABLET | Freq: Every day | ORAL | 11 refills | Status: AC

## 2020-05-30 NOTE — Progress Notes (Signed)
Pt presents for pregnancy test. She was informed of positive result. She reports LMP 04/10/20 which yields EDD 01/15/21, now [redacted]w[redacted]d. Pt denies vaginal bleeding or abdominal pain except occasional mild cramp. She was advised to contact [facility] to schedule prenatal care. She was instructed to go to [facility] if she develops heavy vaginal bleeding or severe abdominal pain. Pt voiced understanding of information and instructions given.

## 2020-05-30 NOTE — Progress Notes (Signed)
  History:  [name] is a 30 y.o. G1P1001 who presents to clinic today with complaint of possible pregnancy.    Past Medical History:  Diagnosis Date  . Dysrhythmia 10/02/11   "lately heart is beating faster"  . Headache(784.0) 10/02/11   "weekly"  . Pleural effusion 10/02/11  . TB (pulmonary tuberculosis) questionable   hospitalized in 2014 treated until negative    Past Surgical History:  Procedure Laterality Date  . NO PAST SURGERIES    . PLEURAL EFFUSION DRAINAGE  10/04/2011   Procedure: DRAINAGE OF PLEURAL EFFUSION;  Surgeon: Alleen Borne, MD;  Location: [facility] OR;  Service: Thoracic;  Laterality: Left;    The following portions of the patient's history were reviewed and updated as appropriate: allergies, current medications, past family history, past medical history, past social history, past surgical history and problem list.   Review of Systems:  Pertinent items noted in HPI and remainder of comprehensive ROS otherwise negative.  Objective:  Physical Exam There were no vitals taken for this visit. Physical Exam GENERAL: Well-developed, well-nourished female in no acute distress.  HEENT: Normocephalic, atraumatic.  LUNGS: Effort normal  HEART: Regular rate  SKIN: Warm, dry and without erythema  PSYCH: Normal mood and affect    Labs and Imaging Results for orders placed or performed in visit on 05/30/20 (from the past 24 hour(s))  Pregnancy, urine POC     Status: Abnormal   Collection Time: 05/30/20  4:02 PM  Result Value Ref Range   Preg Test, Ur POSITIVE (A) NEGATIVE    No results found.   Assessment & Plan:  1. Possible pregnancy Positive pregnancy test Will prescribe prenatal vistamins [facility] Is excited to get positive pregnancy test Verification of pregnancy given.    Approximately 15 minutes of total time was spent with this patient on history taking, coordination of care, education and  documentation.   Currie Paris, NP 05/30/2020 9:59 PM

## 2020-06-12 ENCOUNTER — Inpatient Hospital Stay (HOSPITAL_COMMUNITY): Payer: Medicaid Other

## 2020-06-12 ENCOUNTER — Inpatient Hospital Stay (HOSPITAL_COMMUNITY)
Admission: AD | Admit: 2020-06-12 | Discharge: 2020-06-12 | Disposition: A | Discharge status: Home / Self Care | Payer: Medicaid Other | Attending: Obstetrics and Gynecology | Admitting: Obstetrics and Gynecology

## 2020-06-12 ENCOUNTER — Encounter (HOSPITAL_COMMUNITY): Payer: Self-pay

## 2020-06-12 ENCOUNTER — Emergency Department (HOSPITAL_COMMUNITY)
Admission: EM | Admit: 2020-06-12 | Discharge: 2020-06-12 | Disposition: A | Discharge status: Other Acute Inpatient Hospital | Payer: Medicaid Other

## 2020-06-12 DIAGNOSIS — O26891 Other specified pregnancy related conditions, first trimester: Secondary | ICD-10-CM | POA: Insufficient documentation

## 2020-06-12 DIAGNOSIS — R109 Unspecified abdominal pain: Secondary | ICD-10-CM

## 2020-06-12 DIAGNOSIS — R102 Pelvic and perineal pain: Secondary | ICD-10-CM

## 2020-06-12 DIAGNOSIS — O208 Other hemorrhage in early pregnancy: Secondary | ICD-10-CM | POA: Diagnosis not present

## 2020-06-12 DIAGNOSIS — R1032 Left lower quadrant pain: Secondary | ICD-10-CM | POA: Diagnosis not present

## 2020-06-12 DIAGNOSIS — Z3A01 Less than 8 weeks gestation of pregnancy: Secondary | ICD-10-CM | POA: Diagnosis not present

## 2020-06-12 DIAGNOSIS — Z3A09 9 weeks gestation of pregnancy: Secondary | ICD-10-CM

## 2020-06-12 DIAGNOSIS — O99011 Anemia complicating pregnancy, first trimester: Secondary | ICD-10-CM | POA: Insufficient documentation

## 2020-06-12 DIAGNOSIS — O99019 Anemia complicating pregnancy, unspecified trimester: Secondary | ICD-10-CM

## 2020-06-12 DIAGNOSIS — O26899 Other specified pregnancy related conditions, unspecified trimester: Secondary | ICD-10-CM

## 2020-06-12 LAB — URINALYSIS, ROUTINE W REFLEX MICROSCOPIC
Bacteria, UA: NONE SEEN
Bilirubin Urine: NEGATIVE
Glucose, UA: NEGATIVE mg/dL
Hgb urine dipstick: NEGATIVE
Ketones, ur: NEGATIVE mg/dL
Nitrite: NEGATIVE
Protein, ur: NEGATIVE mg/dL
Specific Gravity, Urine: 1.014 (ref 1.005–1.030)
pH: 7 (ref 5.0–8.0)

## 2020-06-12 LAB — CBC
HCT: 27.4 % — ABNORMAL LOW (ref 36.0–46.0)
Hemoglobin: 7.6 g/dL — ABNORMAL LOW (ref 12.0–15.0)
MCH: 17 pg — ABNORMAL LOW (ref 26.0–34.0)
MCHC: 27.7 g/dL — ABNORMAL LOW (ref 30.0–36.0)
MCV: 61.2 fL — ABNORMAL LOW (ref 80.0–100.0)
Platelets: 385 10*3/uL (ref 150–400)
RBC: 4.48 MIL/uL (ref 3.87–5.11)
RDW: 19.2 % — ABNORMAL HIGH (ref 11.5–15.5)
WBC: 6.8 10*3/uL (ref 4.0–10.5)
nRBC: 0 % (ref 0.0–0.2)

## 2020-06-12 LAB — WET PREP, GENITAL
Clue Cells Wet Prep HPF POC: NONE SEEN
Sperm: NONE SEEN
Trich, Wet Prep: NONE SEEN
Yeast Wet Prep HPF POC: NONE SEEN

## 2020-06-12 LAB — HCG, QUANTITATIVE, PREGNANCY: hCG, Beta Chain, Quant, S: 36113 m[IU]/mL — ABNORMAL HIGH (ref <5)

## 2020-06-12 MED ORDER — FERROUS SULFATE 325 (65 FE) MG PO TBEC: 325.0000 mg | DELAYED_RELEASE_TABLET | ORAL | 3 refills | Status: DC

## 2020-06-12 MED ORDER — ACETAMINOPHEN 500 MG PO TABS
1000.0000 mg | ORAL_TABLET | Freq: Once | ORAL | Status: AC
  Administered 2020-06-12: 1000 mg via ORAL
  Filled 2020-06-12: qty 2

## 2020-06-12 NOTE — Discharge Instructions (Signed)
Dolor abdominal durante el embarazo Abdominal Pain During Pregnancy El dolor abdominal es comn durante el embarazo y tiene muchas causas posibles. Algunas causas son ms graves que otras, y a veces la causa se desconoce. El dolor abdominal puede ser un indicio de que est comenzando el parto. Tambin puede ser ocasionado por el crecimiento normal del beb que provoca estiramiento de los msculos y ligamentos durante el embarazo. Siempre informe a su mdico si siente dolor abdominal. Siga estas instrucciones en su casa:  No tenga relaciones sexuales ni se coloque nada dentro de la vagina hasta que el dolor haya desaparecido completamente.  Descanse todo lo que pueda hasta que el dolor se le haya calmado.  Beba suficiente lquido como para mantener la orina de color amarillo plido.  Use los medicamentos de venta libre y los recetados solamente como se lo haya indicado el mdico.  Cumpla con todas las visitas de seguimiento. Esto es importante.   Comunquese con un mdico si:  El dolor contina o empeora despus de descansar.  Siente dolor en la parte inferior del abdomen que: ? Va y viene en intervalos regulares. ? Se extiende a la espalda. ? Es parecido a los clicos menstruales.  Siente dolor o ardor al orinar. Solicite ayuda de inmediato si:  Tiene fiebre, escalofros o falta de aire.  Tiene una hemorragia vaginal abundante.  Tiene prdida de lquidos o elimina tejidos por la vagina.  Vomita o tiene diarrea durante ms de 24horas.  El beb se mueve menos de lo habitual.  Se siente dbil o se desmaya.  Siente dolor intenso en la parte superior del abdomen. Resumen  El dolor abdominal es comn durante el embarazo y tiene muchas causas posibles.  Si siente dolor abdominal durante el embarazo, informe al mdico de inmediato.  Siga las instrucciones del mdico para el cuidado en el hogar y concurra a todas las visitas de seguimiento como se lo hayan indicado. Esta  informacin no tiene como fin reemplazar el consejo del mdico. Asegrese de hacerle al mdico cualquier pregunta que tenga. Document Revised: 12/17/2019 Document Reviewed: 12/17/2019 Elsevier Patient Education  2021 [employer]  

## 2020-06-12 NOTE — MAU Provider Note (Signed)
History     CSN: [id number]  Arrival date and time: 06/12/20 0127   Event Date/Time   First Provider Initiated Contact with Patient 06/12/20 0201      Chief Complaint  Patient presents with  . Abdominal Pain   Gloria Kent is a 30 y.o. G2P1001 at [redacted]w[redacted]d who plans to receive care at [facility].  She presents today for Abdominal Pain.  She states it started about 3 days ago and has been constant.  She reports it is located in her left side and describes it as pressure.  She states it could be related to "stress."  She rates the pain a 8/10.     OB History    Gravida  2   Para  1   Term  1   Preterm      AB      Living  1     SAB      IAB      Ectopic      Multiple  0   Live Births  1           Past Medical History:  Diagnosis Date  . Dysrhythmia 10/02/11   "lately heart is beating faster"  . Headache(784.0) 10/02/11   "weekly"  . Pleural effusion 10/02/11  . TB (pulmonary tuberculosis) questionable   hospitalized in 2014 treated until negative    Past Surgical History:  Procedure Laterality Date  . NO PAST SURGERIES    . PLEURAL EFFUSION DRAINAGE  10/04/2011   Procedure: DRAINAGE OF PLEURAL EFFUSION;  Surgeon: Alleen Borne, MD;  Location: [facility] OR;  Service: Thoracic;  Laterality: Left;    Family History  Problem Relation Age of Onset  . Hypertension Maternal Grandmother     Social History   Tobacco Use  . Smoking status: Never Smoker  . Smokeless tobacco: Never Used  Vaping Use  . Vaping Use: Never used  Substance Use Topics  . Alcohol use: No  . Drug use: No    Allergies: No Known Allergies  Medications Prior to Admission  Medication Sig Dispense Refill Last Dose  . acetaminophen (TYLENOL) 325 MG tablet Take 2 tablets (650 mg total) by mouth every 4 (four) hours as needed (for pain scale < 4). (Patient not taking: Reported on 05/30/2020) 30 tablet 0   . metroNIDAZOLE (FLAGYL) 500 MG tablet Take 1 tablet (500 mg total) by mouth 2 (two)  times daily. (Patient not taking: Reported on 05/30/2020) 14 tablet 0   . ondansetron (ZOFRAN ODT) 4 MG disintegrating tablet Take 1 tablet (4 mg total) by mouth every 8 (eight) hours as needed for nausea or vomiting. (Patient not taking: Reported on 05/30/2020) 20 tablet 0   . Prenatal Vit-DSS-Fe Fum-FA (PRENATAL 19) 29-1 MG TABS Take 1 tablet by mouth daily. 30 tablet 11     Review of Systems  Gastrointestinal: Positive for abdominal pain and nausea. Negative for constipation, diarrhea and vomiting.  Genitourinary: Negative for difficulty urinating, dysuria, vaginal bleeding and vaginal discharge.  Neurological: Positive for dizziness and headaches (5/10). Negative for light-headedness.   Physical Exam   Blood pressure 108/72, pulse 100, temperature 98.1 F (36.7 C), temperature source Oral, resp. rate 18, height 5\' 3"  (1.6 m), weight 66.3 kg, last menstrual period 04/10/2020, SpO2 98 %, unknown if currently breastfeeding.  Physical Exam Vitals reviewed.  Constitutional:      Appearance: Normal appearance. She is well-developed.  HENT:     Head: Normocephalic and atraumatic.  Eyes:  Conjunctiva/sclera: Conjunctivae normal.  Cardiovascular:     Rate and Rhythm: Normal rate and regular rhythm.  Pulmonary:     Effort: Pulmonary effort is normal.  Abdominal:     General: Abdomen is flat. Bowel sounds are normal.     Tenderness: There is abdominal tenderness in the suprapubic area, left upper quadrant and left lower quadrant.  Musculoskeletal:        General: Normal range of motion.     Cervical back: Normal range of motion.  Skin:    General: Skin is warm and dry.  Neurological:     Mental Status: She is alert and oriented to person, place, and time.  Psychiatric:        Mood and Affect: Mood normal.        Behavior: Behavior normal.        Thought Content: Thought content normal.     MAU Course  Procedures Results for orders placed or performed during the hospital  encounter of 06/12/20 (from the past 24 hour(s))  hCG, quantitative, pregnancy     Status: Abnormal   Collection Time: 06/12/20  1:41 AM  Result Value Ref Range   hCG, Beta Chain, Quant, S 36,113 (H) <5 mIU/mL  CBC     Status: Abnormal   Collection Time: 06/12/20  1:41 AM  Result Value Ref Range   WBC 6.8 4.0 - 10.5 K/uL   RBC 4.48 3.87 - 5.11 MIL/uL   Hemoglobin 7.6 (L) 12.0 - 15.0 g/dL   HCT 09.4 (L) 70.9 - 62.8 %   MCV 61.2 (L) 80.0 - 100.0 fL   MCH 17.0 (L) 26.0 - 34.0 pg   MCHC 27.7 (L) 30.0 - 36.0 g/dL   RDW 36.6 (H) 29.4 - 76.5 %   Platelets 385 150 - 400 K/uL   nRBC 0.0 0.0 - 0.2 %  Urinalysis, Routine w reflex microscopic     Status: Abnormal   Collection Time: 06/12/20  2:14 AM  Result Value Ref Range   Color, Urine YELLOW YELLOW   APPearance HAZY (A) CLEAR   Specific Gravity, Urine 1.014 1.005 - 1.030   pH 7.0 5.0 - 8.0   Glucose, UA NEGATIVE NEGATIVE mg/dL   Hgb urine dipstick NEGATIVE NEGATIVE   Bilirubin Urine NEGATIVE NEGATIVE   Ketones, ur NEGATIVE NEGATIVE mg/dL   Protein, ur NEGATIVE NEGATIVE mg/dL   Nitrite NEGATIVE NEGATIVE   Leukocytes,Ua LARGE (A) NEGATIVE   RBC / HPF 0-5 0 - 5 RBC/hpf   WBC, UA 6-10 0 - 5 WBC/hpf   Bacteria, UA NONE SEEN NONE SEEN   Squamous Epithelial / LPF 6-10 0 - 5   Mucus PRESENT   Wet prep, genital     Status: Abnormal   Collection Time: 06/12/20  2:19 AM   Specimen: PATH Cytology Cervicovaginal Ancillary Only  Result Value Ref Range   Yeast Wet Prep HPF POC NONE SEEN NONE SEEN   Trich, Wet Prep NONE SEEN NONE SEEN   Clue Cells Wet Prep HPF POC NONE SEEN NONE SEEN   WBC, Wet Prep HPF POC MANY (A) NONE SEEN   Sperm NONE SEEN    US OB Comp Less 14 Wks  Result Date: 06/12/2020 CLINICAL DATA:  Left lower quadrant pain EXAM: OBSTETRIC <14 WK ULTRASOUND TECHNIQUE: Transabdominal ultrasound was performed for evaluation of the gestation as well as the maternal uterus and adnexal regions. COMPARISON:  None. FINDINGS: Intrauterine  gestational sac: Single Yolk sac:  Visualized. Embryo:  Visualized. Cardiac Activity: Visualized.  Heart Rate: 107 bpm MSD:    mm    w     d CRL:   2.9 mm   5 w 5 d                  [place] EDC: 02/07/2021 Subchorionic hemorrhage:  Small subchorionic hemorrhage Maternal uterus/adnexae: No adnexal mass or free fluid. IMPRESSION: Five week 5 day intrauterine pregnancy. Fetal heart rate 107 beats per minute. Small subchorionic hemorrhage. Electronically Signed   By: Charlett Nose M.D.   On: 06/12/2020 02:48    MDM Pelvic Exam; Wet Prep and GC/CT Labs: UA, UPT, CBC, hCG, ABO Ultrasound Assessment and Plan  30 year old  G2P1001 at 9 weeks Abdominal Pain  -POC Reviewed. -Patient to collect cultures via self swab -Patient offered and declines pain medication. -Will send for [place] and await results.   Joyce Copa Wilber Fini 06/12/2020, 2:01 AM   Reassessment (3:08 AM) SIUP at 5.5 weeks Abdominal Pain  -Informed of US findings and need for change in EDD. -New EDD 02/07/2021. -Wet prep without significant findings and GC/CT remains pending. -Patient offered and now accepts pain medication. -Will give tylenol XR now. -Addressed concerns regarding abdominal pain and informed that pain could be pregnancy related or not.  Instructed to continue to monitor.  -Discussed usage of tylenol during pregnancy to treat pain.  Encouraged to take when necessary and then report lack of improvements. -Addressed questions regarding initiation of prenatal care. -Discussed iron level and need for iron supplementation and possible iron infusion. -Reviewed administration as every other day dosing. -Instructed to call and schedule initial PNV ASAP to avoid delays.  -Bleeding Precautions given. -Patient speaks limited English and SO acting as interpreter when necessary.  -Encouraged to call or return to MAU if symptoms worsen or with the onset of new symptoms. -Discharged to home in stable condition.  Cherre Robins MSN,  CNM Advanced Practice Provider, [facility] [employer]

## 2020-06-13 LAB — GC/CHLAMYDIA PROBE AMP (~~LOC~~) NOT AT ARMC
Chlamydia: NEGATIVE
Comment: NEGATIVE
Comment: NORMAL
Neisseria Gonorrhea: NEGATIVE

## 2020-07-05 ENCOUNTER — Encounter: Payer: Self-pay | Admitting: Family Medicine

## 2020-07-11 DIAGNOSIS — Z3481 Encounter for supervision of other normal pregnancy, first trimester: Secondary | ICD-10-CM | POA: Diagnosis not present

## 2020-07-11 DIAGNOSIS — Z789 Other specified health status: Secondary | ICD-10-CM | POA: Diagnosis not present

## 2020-07-11 DIAGNOSIS — O99019 Anemia complicating pregnancy, unspecified trimester: Secondary | ICD-10-CM | POA: Diagnosis not present

## 2020-07-11 DIAGNOSIS — Z3482 Encounter for supervision of other normal pregnancy, second trimester: Secondary | ICD-10-CM | POA: Diagnosis not present

## 2020-07-11 DIAGNOSIS — A150 Tuberculosis of lung: Secondary | ICD-10-CM | POA: Diagnosis not present

## 2020-07-11 LAB — OB RESULTS CONSOLE HEPATITIS B SURFACE ANTIGEN: Hepatitis B Surface Ag: NEGATIVE

## 2020-07-11 LAB — OB RESULTS CONSOLE RUBELLA ANTIBODY, IGM: Rubella: IMMUNE

## 2020-07-15 ENCOUNTER — Other Ambulatory Visit: Payer: Self-pay

## 2020-07-15 ENCOUNTER — Encounter (HOSPITAL_COMMUNITY): Payer: Self-pay | Admitting: Obstetrics and Gynecology

## 2020-07-15 ENCOUNTER — Inpatient Hospital Stay (HOSPITAL_COMMUNITY)
Admission: AD | Admit: 2020-07-15 | Discharge: 2020-07-16 | Disposition: A | Discharge status: Home / Self Care | Payer: Medicaid Other | Attending: Obstetrics and Gynecology | Admitting: Obstetrics and Gynecology

## 2020-07-15 DIAGNOSIS — O23591 Infection of other part of genital tract in pregnancy, first trimester: Secondary | ICD-10-CM | POA: Insufficient documentation

## 2020-07-15 DIAGNOSIS — R109 Unspecified abdominal pain: Secondary | ICD-10-CM | POA: Insufficient documentation

## 2020-07-15 DIAGNOSIS — Z3A13 13 weeks gestation of pregnancy: Secondary | ICD-10-CM | POA: Insufficient documentation

## 2020-07-15 DIAGNOSIS — Z3A11 11 weeks gestation of pregnancy: Secondary | ICD-10-CM | POA: Diagnosis not present

## 2020-07-15 DIAGNOSIS — B9689 Other specified bacterial agents as the cause of diseases classified elsewhere: Secondary | ICD-10-CM | POA: Diagnosis not present

## 2020-07-15 DIAGNOSIS — R102 Pelvic and perineal pain: Secondary | ICD-10-CM | POA: Diagnosis not present

## 2020-07-15 DIAGNOSIS — O26891 Other specified pregnancy related conditions, first trimester: Secondary | ICD-10-CM | POA: Insufficient documentation

## 2020-07-15 DIAGNOSIS — Z3A10 10 weeks gestation of pregnancy: Secondary | ICD-10-CM | POA: Diagnosis not present

## 2020-07-15 DIAGNOSIS — B373 Candidiasis of vulva and vagina: Secondary | ICD-10-CM | POA: Diagnosis not present

## 2020-07-15 NOTE — MAU Provider Note (Incomplete)
  History     CSN: [id number]  Arrival date and time: 07/15/20 2259   Event Date/Time   First Provider Initiated Contact with Patient 07/15/20 2334      Chief Complaint  Patient presents with  . Abdominal Pain   HPI  OB History    Gravida  2   Para  1   Term  1   Preterm      AB      Living  1     SAB      IAB      Ectopic      Multiple  0   Live Births  1           Past Medical History:  Diagnosis Date  . Dysrhythmia 10/02/11   "lately heart is beating faster"  . Headache(784.0) 10/02/11   "weekly"  . Pleural effusion 10/02/11  . TB (pulmonary tuberculosis) questionable   hospitalized in 2014 treated until negative    Past Surgical History:  Procedure Laterality Date  . NO PAST SURGERIES    . PLEURAL EFFUSION DRAINAGE  10/04/2011   Procedure: DRAINAGE OF PLEURAL EFFUSION;  Surgeon: Alleen Borne, MD;  Location: [facility] OR;  Service: Thoracic;  Laterality: Left;    Family History  Problem Relation Age of Onset  . Hypertension Maternal Grandmother     Social History   Tobacco Use  . Smoking status: Never Smoker  . Smokeless tobacco: Never Used  Vaping Use  . Vaping Use: Never used  Substance Use Topics  . Alcohol use: No  . Drug use: No    Allergies: No Known Allergies  Medications Prior to Admission  Medication Sig Dispense Refill Last Dose  . ferrous sulfate 325 (65 FE) MG EC tablet Take 1 tablet (325 mg total) by mouth every other day. 45 tablet 3 07/15/2020 at Unknown time  . Prenatal Vit-DSS-Fe Fum-FA (PRENATAL 19) 29-1 MG TABS Take 1 tablet by mouth daily. 30 tablet 11 07/15/2020 at Unknown time  . acetaminophen (TYLENOL) 325 MG tablet Take 2 tablets (650 mg total) by mouth every 4 (four) hours as needed (for pain scale < 4). (Patient not taking: Reported on 05/30/2020) 30 tablet 0     Review of Systems Physical Exam   Blood pressure 100/64, pulse 94, temperature 98 F (36.7 C), resp. rate 18, height 5\' 3"  (1.6 m), weight 64.4 kg,  last menstrual period 04/10/2020, unknown if currently breastfeeding.  Physical Exam  MAU Course  Procedures  MDM ***  Assessment and Plan  ***  04/12/2020, CNM 07/15/2020, 11:34 PM

## 2020-07-15 NOTE — MAU Provider Note (Signed)
History     CSN: [id number]  Arrival date and time: 07/15/20 2259   Event Date/Time   First Provider Initiated Contact with Patient 07/15/20 2334      Chief Complaint  Patient presents with  . Abdominal Pain   [name] is a 30 y.o. year old G41P1001 female at [redacted]w[redacted]d weeks gestation who presents to MAU reporting lower abdominal pain/cramping x 3-4 days. She states the pain "feels like when she was having her last baby." She reports the pain increases with walking. She denies VB or abnormal vaginal d/c. She was seen in MAU on 06/12/2020. She was dx'd with a [facility] on U/S during that visit. She receives [facility] at the [facility]. Her s.o. is present and contributing to the history taking.   OB History    Gravida  2   Para  1   Term  1   Preterm      AB      Living  1     SAB      IAB      Ectopic      Multiple  0   Live Births  1           Past Medical History:  Diagnosis Date  . Dysrhythmia 10/02/11   "lately heart is beating faster"  . Headache(784.0) 10/02/11   "weekly"  . Pleural effusion 10/02/11  . TB (pulmonary tuberculosis) questionable   hospitalized in 2014 treated until negative    Past Surgical History:  Procedure Laterality Date  . NO PAST SURGERIES    . PLEURAL EFFUSION DRAINAGE  10/04/2011   Procedure: DRAINAGE OF PLEURAL EFFUSION;  Surgeon: Alleen Borne, MD;  Location: [facility] OR;  Service: Thoracic;  Laterality: Left;    Family History  Problem Relation Age of Onset  . Hypertension Maternal Grandmother     Social History   Tobacco Use  . Smoking status: Never Smoker  . Smokeless tobacco: Never Used  Vaping Use  . Vaping Use: Never used  Substance Use Topics  . Alcohol use: No  . Drug use: No    Allergies: No Known Allergies  Medications Prior to Admission  Medication Sig Dispense Refill Last Dose  . ferrous sulfate 325 (65 FE) MG EC tablet Take 1 tablet (325 mg total) by mouth every other day. 45 tablet 3 07/15/2020 at Unknown  time  . Prenatal Vit-DSS-Fe Fum-FA (PRENATAL 19) 29-1 MG TABS Take 1 tablet by mouth daily. 30 tablet 11 07/15/2020 at Unknown time  . acetaminophen (TYLENOL) 325 MG tablet Take 2 tablets (650 mg total) by mouth every 4 (four) hours as needed (for pain scale < 4). (Patient not taking: Reported on 05/30/2020) 30 tablet 0     Review of Systems  Constitutional: Negative.   HENT: Negative.   Eyes: Negative.   Respiratory: Negative.   Cardiovascular: Negative.   Gastrointestinal: Negative.   Endocrine: Negative.   Genitourinary: Positive for pelvic pain (lower abdominal cramping/pain; "feels like when I was having my last baby."; pain increases with walking).  Musculoskeletal: Negative.   Skin: Negative.   Allergic/Immunologic: Negative.   Neurological: Negative.   Hematological: Negative.   Psychiatric/Behavioral: Negative.    Physical Exam   Blood pressure 100/64, pulse 94, temperature 98 F (36.7 C), resp. rate 18, height 5\' 3"  (1.6 m), weight 64.4 kg, last menstrual period 04/10/2020, unknown if currently breastfeeding.  Physical Exam Vitals and nursing note reviewed. Exam conducted with a chaperone present.  Constitutional:  Appearance: Normal appearance. She is normal weight.  Cardiovascular:     Rate and Rhythm: Normal rate.  Pulmonary:     Effort: Pulmonary effort is normal.  Abdominal:     General: Abdomen is flat.     Palpations: Abdomen is soft.  Genitourinary:    General: Normal vulva.     Comments: Pelvic exam: External genitalia normal, SE: vaginal walls pink and well rugated, cervix is smooth, pink, no lesions, moderate amt of thick, yellowish-white vaginal d/c -- WP, GC/CT done, cervix: 1/long/firm, Uterus is non-tender, S=D, no CMT or friability, no adnexal tenderness.  Skin:    General: Skin is warm and dry.  Neurological:     Mental Status: She is alert and oriented to person, place, and time.  Psychiatric:        Mood and Affect: Mood normal.         Behavior: Behavior normal.        Thought Content: Thought content normal.        Judgment: Judgment normal.    MAU Course  Procedures  MDM CCUA Wet Prep GC/CT -- Results pending  Results for orders placed or performed during the hospital encounter of 07/15/20 (from the past 24 hour(s))  Urinalysis, Routine w reflex microscopic Urine, Clean Catch     Status: Abnormal   Collection Time: 07/15/20 11:11 PM  Result Value Ref Range   Color, Urine STRAW (A) YELLOW   APPearance HAZY (A) CLEAR   Specific Gravity, Urine 1.008 1.005 - 1.030   pH 6.0 5.0 - 8.0   Glucose, UA NEGATIVE NEGATIVE mg/dL   Hgb urine dipstick NEGATIVE NEGATIVE   Bilirubin Urine NEGATIVE NEGATIVE   Ketones, ur NEGATIVE NEGATIVE mg/dL   Protein, ur NEGATIVE NEGATIVE mg/dL   Nitrite NEGATIVE NEGATIVE   Leukocytes,Ua LARGE (A) NEGATIVE   RBC / HPF 0-5 0 - 5 RBC/hpf   WBC, UA 11-20 0 - 5 WBC/hpf   Bacteria, UA RARE (A) NONE SEEN   Squamous Epithelial / LPF 0-5 0 - 5   Mucus PRESENT   Wet prep, genital     Status: Abnormal   Collection Time: 07/16/20 12:43 AM   Specimen: Vaginal  Result Value Ref Range   Yeast Wet Prep HPF POC NONE SEEN NONE SEEN   Trich, Wet Prep NONE SEEN NONE SEEN   Clue Cells Wet Prep HPF POC PRESENT (A) NONE SEEN   WBC, Wet Prep HPF POC MANY (A) NONE SEEN   Sperm NONE SEEN     US OB Comp Less 14 Wks  Result Date: 07/16/2020 CLINICAL DATA:  31 year old pregnant female with pelvic pain. LMP: 04/10/2020 corresponding to an estimated gestational age of [redacted] weeks, 6 days. EXAM: OBSTETRIC <14 WK ULTRASOUND TECHNIQUE: Transabdominal ultrasound was performed for evaluation of the gestation as well as the maternal uterus and adnexal regions. COMPARISON:  Ultrasound dated 06/12/2020. FINDINGS: Intrauterine gestational sac: Single intrauterine gestational sac. Yolk sac:  Not identified Embryo:  Present Cardiac Activity: Detected Heart Rate: 167 bpm CRL: 47 mm   11 w 4 d                  [place] EDC: 01/31/2021  Subchorionic hemorrhage:  None visualized. Maternal uterus/adnexae: The maternal ovaries are unremarkable. IMPRESSION: Single live intrauterine pregnancy with an estimated gestational age of [redacted] weeks, 4 days based on crown-rump length. Electronically Signed   By: Elgie Collard M.D.   On: 07/16/2020 01:38    Assessment and Plan  Abdominal  cramping affecting pregnancy - Advised the abdominal cramping is more than likely caused by BV  [redacted] weeks gestation of pregnancy  Bacterial vaginosis - Information provided on BV - Rx for Metrogel 1 per vagina hs x 5 days   - Discharge home - Patient verbalized an understanding of the plan of care and agrees.   Raelyn Mora, CNM 07/15/2020, 11:34 PM

## 2020-07-15 NOTE — MAU Note (Signed)
Pt reports she is having lower abd pain and cramping x 3-4 days. Pain feels like when she was having her last baby. Denies any vag bleeding or discharge. Pain gets worse when she walks

## 2020-07-16 ENCOUNTER — Inpatient Hospital Stay (HOSPITAL_COMMUNITY): Payer: Medicaid Other

## 2020-07-16 DIAGNOSIS — R109 Unspecified abdominal pain: Secondary | ICD-10-CM

## 2020-07-16 DIAGNOSIS — O26891 Other specified pregnancy related conditions, first trimester: Secondary | ICD-10-CM | POA: Diagnosis not present

## 2020-07-16 DIAGNOSIS — O23591 Infection of other part of genital tract in pregnancy, first trimester: Secondary | ICD-10-CM

## 2020-07-16 DIAGNOSIS — B373 Candidiasis of vulva and vagina: Secondary | ICD-10-CM | POA: Diagnosis not present

## 2020-07-16 DIAGNOSIS — R102 Pelvic and perineal pain: Secondary | ICD-10-CM | POA: Diagnosis not present

## 2020-07-16 DIAGNOSIS — Z3A11 11 weeks gestation of pregnancy: Secondary | ICD-10-CM | POA: Diagnosis not present

## 2020-07-16 DIAGNOSIS — Z3A10 10 weeks gestation of pregnancy: Secondary | ICD-10-CM

## 2020-07-16 LAB — WET PREP, GENITAL
Sperm: NONE SEEN
Trich, Wet Prep: NONE SEEN
Yeast Wet Prep HPF POC: NONE SEEN

## 2020-07-16 LAB — URINALYSIS, ROUTINE W REFLEX MICROSCOPIC
Bilirubin Urine: NEGATIVE
Glucose, UA: NEGATIVE mg/dL
Hgb urine dipstick: NEGATIVE
Ketones, ur: NEGATIVE mg/dL
Nitrite: NEGATIVE
Protein, ur: NEGATIVE mg/dL
Specific Gravity, Urine: 1.008 (ref 1.005–1.030)
pH: 6 (ref 5.0–8.0)

## 2020-07-16 MED ORDER — METRONIDAZOLE 0.75 % VA GEL: 1.0000 | Freq: Every day | VAGINAL | 0 refills | Status: AC

## 2020-07-18 LAB — GC/CHLAMYDIA PROBE AMP (~~LOC~~) NOT AT ARMC
Chlamydia: NEGATIVE
Comment: NEGATIVE
Comment: NORMAL
Neisseria Gonorrhea: NEGATIVE

## 2020-07-25 DIAGNOSIS — Z3481 Encounter for supervision of other normal pregnancy, first trimester: Secondary | ICD-10-CM | POA: Diagnosis not present

## 2020-07-25 DIAGNOSIS — O3680X0 Pregnancy with inconclusive fetal viability, not applicable or unspecified: Secondary | ICD-10-CM | POA: Diagnosis not present

## 2020-07-25 DIAGNOSIS — Z3A12 12 weeks gestation of pregnancy: Secondary | ICD-10-CM | POA: Diagnosis not present

## 2020-07-25 DIAGNOSIS — Z3682 Encounter for antenatal screening for nuchal translucency: Secondary | ICD-10-CM | POA: Diagnosis not present

## 2020-08-22 DIAGNOSIS — O99019 Anemia complicating pregnancy, unspecified trimester: Secondary | ICD-10-CM | POA: Diagnosis not present

## 2020-08-22 DIAGNOSIS — Z3482 Encounter for supervision of other normal pregnancy, second trimester: Secondary | ICD-10-CM | POA: Diagnosis not present

## 2020-08-22 LAB — OB RESULTS CONSOLE VARICELLA ZOSTER ANTIBODY, IGG: Varicella: NON-IMMUNE/NOT IMMUNE

## 2020-08-23 ENCOUNTER — Other Ambulatory Visit (HOSPITAL_COMMUNITY): Payer: Self-pay | Admitting: *Deleted

## 2020-08-26 ENCOUNTER — Encounter (HOSPITAL_COMMUNITY)
Admission: RE | Admit: 2020-08-26 | Discharge: 2020-08-26 | Disposition: A | Discharge status: Home / Self Care | Payer: Medicaid Other | Source: Ambulatory Visit | Attending: Obstetrics & Gynecology | Admitting: Obstetrics & Gynecology

## 2020-08-26 ENCOUNTER — Other Ambulatory Visit: Payer: Self-pay

## 2020-08-26 DIAGNOSIS — O99012 Anemia complicating pregnancy, second trimester: Secondary | ICD-10-CM | POA: Insufficient documentation

## 2020-08-26 MED ORDER — SODIUM CHLORIDE 0.9 % IV SOLN
510.0000 mg | INTRAVENOUS | Status: DC
  Administered 2020-08-26: 510 mg via INTRAVENOUS
  Filled 2020-08-26: qty 510

## 2020-09-02 ENCOUNTER — Other Ambulatory Visit: Payer: Self-pay

## 2020-09-02 ENCOUNTER — Encounter (HOSPITAL_COMMUNITY)
Admission: RE | Admit: 2020-09-02 | Discharge: 2020-09-02 | Disposition: A | Discharge status: Home / Self Care | Payer: Medicaid Other | Source: Ambulatory Visit | Attending: Obstetrics & Gynecology | Admitting: Obstetrics & Gynecology

## 2020-09-02 DIAGNOSIS — O99012 Anemia complicating pregnancy, second trimester: Secondary | ICD-10-CM | POA: Diagnosis not present

## 2020-09-02 MED ORDER — SODIUM CHLORIDE 0.9 % IV SOLN
510.0000 mg | INTRAVENOUS | Status: DC
  Administered 2020-09-02: 510 mg via INTRAVENOUS
  Filled 2020-09-02: qty 510

## 2020-09-08 ENCOUNTER — Other Ambulatory Visit: Payer: Self-pay

## 2020-09-08 ENCOUNTER — Inpatient Hospital Stay (HOSPITAL_COMMUNITY)
Admission: AD | Admit: 2020-09-08 | Discharge: 2020-09-08 | Disposition: A | Discharge status: Home / Self Care | Payer: Medicaid Other | Attending: Obstetrics & Gynecology | Admitting: Obstetrics & Gynecology

## 2020-09-08 DIAGNOSIS — O98512 Other viral diseases complicating pregnancy, second trimester: Secondary | ICD-10-CM | POA: Diagnosis present

## 2020-09-08 DIAGNOSIS — Z20822 Contact with and (suspected) exposure to covid-19: Secondary | ICD-10-CM | POA: Diagnosis not present

## 2020-09-08 DIAGNOSIS — Z3A18 18 weeks gestation of pregnancy: Secondary | ICD-10-CM | POA: Diagnosis not present

## 2020-09-08 DIAGNOSIS — J069 Acute upper respiratory infection, unspecified: Secondary | ICD-10-CM

## 2020-09-08 DIAGNOSIS — O99512 Diseases of the respiratory system complicating pregnancy, second trimester: Secondary | ICD-10-CM | POA: Diagnosis not present

## 2020-09-08 LAB — SARS CORONAVIRUS 2 (TAT 6-24 HRS): SARS Coronavirus 2: NEGATIVE

## 2020-09-08 MED ORDER — BENZONATATE 100 MG PO CAPS: 100.0000 mg | ORAL_CAPSULE | Freq: Three times a day (TID) | ORAL | 0 refills | Status: DC

## 2020-09-08 MED ORDER — BENZOCAINE 15 MG MT LOZG
1.0000 | LOZENGE | OROMUCOSAL | 1 refills | Status: DC | PRN
Start: 2020-09-08 — End: 2020-11-15

## 2020-09-08 MED ORDER — ACETAMINOPHEN 325 MG PO TABS: 650.0000 mg | ORAL_TABLET | ORAL | 0 refills | Status: DC | PRN

## 2020-09-08 NOTE — MAU Note (Addendum)
Presents stating she has a cough and sore throat since last week that's not improving.  States when coughing excessively unable to catch her breath.  Possible fever, but didn't check temperature.  Denies pregnancy related issues.  Denies VB or LOF.

## 2020-09-08 NOTE — MAU Provider Note (Signed)
History     [id number]  Arrival date and time: 09/08/20 1156    Chief Complaint  Patient presents with   Cough   Sore Throat     HPI Gloria Kent is a 30 y.o. at [redacted]w[redacted]d by patient report, who presents for cough and intermittent shortness of breath.   Contacted by [facility] earlier today, patient had called reporting cough and some shortness of breath Instructed them to send her to MAU for evaluation to ensure she does not have severe illness  On arrival patient without any obstetrical complaints, no contractions, vaginal bleeding or loss of fluid Reports since early last week when she went on vacation to [place] she has had a persistent cough that often makes her nauseous She reports feeling warm like she had a fever (though not recently) but has not taken her temperature at any point She denies any shortness of breath or chest pain at baseline, though she does develop this when she has prolonged coughing Also with nasal discharge and phlegm production Husband and daughter have not had any symptoms Reports she did go out in public without a mask while on vacation No personal or family hx of blood clots Car ride was 4 hours total, they took breaks on the way there and back     OB History     Gravida  2   Para  1   Term  1   Preterm      AB      Living  1      SAB      IAB      Ectopic      Multiple  0   Live Births  1           Past Medical History:  Diagnosis Date   Dysrhythmia 10/02/11   "lately heart is beating faster"   Headache(784.0) 10/02/11   "weekly"   Pleural effusion 10/02/11   TB (pulmonary tuberculosis) questionable   hospitalized in 2014 treated until negative    Past Surgical History:  Procedure Laterality Date   NO PAST SURGERIES     PLEURAL EFFUSION DRAINAGE  10/04/2011   Procedure: DRAINAGE OF PLEURAL EFFUSION;  Surgeon: Alleen Borne, MD;  Location: [facility] OR;  Service: Thoracic;  Laterality: Left;    Family History   Problem Relation Age of Onset   Hypertension Maternal Grandmother     Social History   Socioeconomic History   Marital status: Married    Spouse name: Delton See   Number of children: Not on file   Years of education: Not on file   Highest education level: Not on file  Occupational History   Not on file  Tobacco Use   Smoking status: Never   Smokeless tobacco: Never  Vaping Use   Vaping Use: Never used  Substance and Sexual Activity   Alcohol use: No   Drug use: No   Sexual activity: Not Currently  Other Topics Concern   Not on file  Social History Narrative   Recent immigrant from [place] 1 year ago.  Now lives with husband in [place]. Feels she has help with baby.   Social Determinants of Health   Financial Resource Strain: Not on file  Food Insecurity: Not on file  Transportation Needs: Not on file  Physical Activity: Not on file  Stress: Not on file  Social Connections: Not on file  Intimate Partner Violence: Not on file    No Known Allergies  No current  facility-administered medications on file prior to encounter.   Current Outpatient Medications on File Prior to Encounter  Medication Sig Dispense Refill   ferrous sulfate 325 (65 FE) MG EC tablet Take 1 tablet (325 mg total) by mouth every other day. 45 tablet 3   Prenatal Vit-DSS-Fe Fum-FA (PRENATAL 19) 29-1 MG TABS Take 1 tablet by mouth daily. 30 tablet 11     ROS Pertinent positives and negative per HPI, all others reviewed and negative  Physical Exam   BP 106/67 (BP Location: Right Arm)   Pulse 100   Temp 98.1 F (36.7 C) (Oral)   Resp 20   Ht 5\' 3"  (1.6 m)   Wt 65.9 kg   LMP 04/10/2020   SpO2 100%   BMI 25.74 kg/m   Patient Vitals for the past 24 hrs:  BP Temp Temp src Pulse Resp SpO2 Height Weight  09/08/20 1224 106/67 98.1 F (36.7 C) Oral 100 20 100 % -- --  09/08/20 1216 -- -- -- -- -- -- 5\' 3"  (1.6 m) 65.9 kg    Physical Exam Vitals reviewed.  Constitutional:      General: She is not  in acute distress.    Appearance: She is well-developed. She is not diaphoretic.  Eyes:     General: No scleral icterus. Cardiovascular:     Rate and Rhythm: Normal rate and regular rhythm.     Heart sounds: Normal heart sounds.  Pulmonary:     Effort: Pulmonary effort is normal. No respiratory distress.     Breath sounds: Normal breath sounds. No wheezing or rales.  Abdominal:     General: There is no distension.     Palpations: Abdomen is soft.     Tenderness: There is no abdominal tenderness. There is no guarding or rebound.  Skin:    General: Skin is warm and dry.  Neurological:     Mental Status: She is alert.     Coordination: Coordination normal.     Cervical Exam    Bedside Ultrasound N/a  My interpretation: n/a  FHT 139 bpm by doppler  Labs No results found for this or any previous visit (from the past 24 hour(s)).  Imaging No results found.  MAU Course  Procedures Lab Orders  SARS CORONAVIRUS 2 (TAT 6-24 HRS) Nasopharyngeal Nasopharyngeal Swab  Meds ordered this encounter  Medications   Benzocaine 15 MG LOZG    Sig: Use as directed 1 lozenge (15 mg total) in the mouth or throat as needed (for cough).    Dispense:  18 lozenge    Refill:  1   benzonatate (TESSALON) 100 MG capsule    Sig: Take 1 capsule (100 mg total) by mouth every 8 (eight) hours.    Dispense:  21 capsule    Refill:  0   acetaminophen (TYLENOL) 325 MG tablet    Sig: Take 2 tablets (650 mg total) by mouth every 4 (four) hours as needed (for pain scale < 4).    Dispense:  100 tablet    Refill:  0   Imaging Orders  No imaging studies ordered today    MDM moderate  Assessment and Plan  #Viral URI with cough Presentation most c/w viral URI, COVID is strong consideration and swab was collected with plan to contact patient by phone if positive. No wheezing to suggest asthma. Traveled recently but vitals are reassuring and she took frequent rests while traveling so PE is unlikely. No  focal lung findings or fever to  suggest PNA. Given symptomatic meds, will also send Paxlovid if she ends up having COVID.   #FWB Normal heart tones by doppler  Discharged to home in stable condition.   Venora Maples, MD/MPH 09/08/20 1:34 PM  Allergies as of 09/08/2020   No Known Allergies      Medication List     TAKE these medications    acetaminophen 325 MG tablet Commonly known as: Tylenol Take 2 tablets (650 mg total) by mouth every 4 (four) hours as needed (for pain scale < 4).   Benzocaine 15 MG Lozg Use as directed 1 lozenge (15 mg total) in the mouth or throat as needed (for cough).   benzonatate 100 MG capsule Commonly known as: TESSALON Take 1 capsule (100 mg total) by mouth every 8 (eight) hours.   ferrous sulfate 325 (65 FE) MG EC tablet Take 1 tablet (325 mg total) by mouth every other day.   Prenatal 19 29-1 MG Tabs Take 1 tablet by mouth daily.

## 2020-09-22 DIAGNOSIS — Z789 Other specified health status: Secondary | ICD-10-CM | POA: Diagnosis not present

## 2020-09-22 DIAGNOSIS — O99012 Anemia complicating pregnancy, second trimester: Secondary | ICD-10-CM | POA: Diagnosis not present

## 2020-09-22 DIAGNOSIS — O99212 Obesity complicating pregnancy, second trimester: Secondary | ICD-10-CM | POA: Diagnosis not present

## 2020-09-22 DIAGNOSIS — A150 Tuberculosis of lung: Secondary | ICD-10-CM | POA: Diagnosis not present

## 2020-09-22 DIAGNOSIS — Z3482 Encounter for supervision of other normal pregnancy, second trimester: Secondary | ICD-10-CM | POA: Diagnosis not present

## 2020-10-04 ENCOUNTER — Inpatient Hospital Stay (HOSPITAL_COMMUNITY)
Admission: AD | Admit: 2020-10-04 | Discharge: 2020-10-05 | Disposition: A | Discharge status: Home / Self Care | Payer: Medicaid Other | Attending: Obstetrics & Gynecology | Admitting: Obstetrics & Gynecology

## 2020-10-04 ENCOUNTER — Encounter (HOSPITAL_COMMUNITY): Payer: Self-pay | Admitting: Obstetrics & Gynecology

## 2020-10-04 ENCOUNTER — Other Ambulatory Visit: Payer: Self-pay

## 2020-10-04 DIAGNOSIS — O26892 Other specified pregnancy related conditions, second trimester: Secondary | ICD-10-CM | POA: Insufficient documentation

## 2020-10-04 DIAGNOSIS — Z3A22 22 weeks gestation of pregnancy: Secondary | ICD-10-CM | POA: Diagnosis not present

## 2020-10-04 DIAGNOSIS — O2342 Unspecified infection of urinary tract in pregnancy, second trimester: Secondary | ICD-10-CM

## 2020-10-04 DIAGNOSIS — Z8249 Family history of ischemic heart disease and other diseases of the circulatory system: Secondary | ICD-10-CM | POA: Insufficient documentation

## 2020-10-04 DIAGNOSIS — R109 Unspecified abdominal pain: Secondary | ICD-10-CM | POA: Insufficient documentation

## 2020-10-04 LAB — URINALYSIS, ROUTINE W REFLEX MICROSCOPIC
Bilirubin Urine: NEGATIVE
Glucose, UA: NEGATIVE mg/dL
Hgb urine dipstick: NEGATIVE
Ketones, ur: NEGATIVE mg/dL
Nitrite: NEGATIVE
Protein, ur: NEGATIVE mg/dL
Specific Gravity, Urine: 1.018 (ref 1.005–1.030)
pH: 6 (ref 5.0–8.0)

## 2020-10-04 LAB — WET PREP, GENITAL
Sperm: NONE SEEN
Trich, Wet Prep: NONE SEEN
Yeast Wet Prep HPF POC: NONE SEEN

## 2020-10-04 MED ORDER — CEPHALEXIN 500 MG PO CAPS
500.0000 mg | ORAL_CAPSULE | Freq: Once | ORAL | Status: AC
  Administered 2020-10-04: 500 mg via ORAL
  Filled 2020-10-04: qty 1

## 2020-10-04 MED ORDER — PHENAZOPYRIDINE HCL 100 MG PO TABS
200.0000 mg | ORAL_TABLET | Freq: Once | ORAL | Status: AC
  Administered 2020-10-04: 200 mg via ORAL
  Filled 2020-10-04: qty 2

## 2020-10-04 MED ORDER — ACETAMINOPHEN 500 MG PO TABS
1000.0000 mg | ORAL_TABLET | Freq: Once | ORAL | Status: AC
  Administered 2020-10-04: 1000 mg via ORAL
  Filled 2020-10-04: qty 2

## 2020-10-04 NOTE — MAU Provider Note (Signed)
History     CSN: [id number]  Arrival date and time: 10/04/20 2156   Event Date/Time   First Provider Initiated Contact with Patient 10/04/20 2249      Chief Complaint  Patient presents with   Abdominal Pain   HPI Gloria Kent is a 30 y.o. G2P1001 at [redacted]w[redacted]d who presents with abdominal pain.  Reports suprapubic pain that started earlier today.  Pain is constant.  Rates 8/10.  Has not treated symptoms.  No aggravating or alleviating factors.  Denies fever, nausea, vomiting, diarrhea, dysuria, hematuria, vaginal bleeding, or vaginal discharge.  [facility].  OB History     Gravida  2   Para  1   Term  1   Preterm      AB      Living  1      SAB      IAB      Ectopic      Multiple  0   Live Births  1           Past Medical History:  Diagnosis Date   Dysrhythmia 10/02/11   "lately heart is beating faster"   Headache(784.0) 10/02/11   "weekly"   Pleural effusion 10/02/11   TB (pulmonary tuberculosis) questionable   hospitalized in 2014 treated until negative    Past Surgical History:  Procedure Laterality Date   NO PAST SURGERIES     PLEURAL EFFUSION DRAINAGE  10/04/2011   Procedure: DRAINAGE OF PLEURAL EFFUSION;  Surgeon: Alleen Borne, MD;  Location: [facility] OR;  Service: Thoracic;  Laterality: Left;    Family History  Problem Relation Age of Onset   Hypertension Maternal Grandmother     Social History   Tobacco Use   Smoking status: Never   Smokeless tobacco: Never  Vaping Use   Vaping Use: Never used  Substance Use Topics   Alcohol use: No   Drug use: No    Allergies: No Known Allergies  Medications Prior to Admission  Medication Sig Dispense Refill Last Dose   Prenatal Vit-DSS-Fe Fum-FA (PRENATAL 19) 29-1 MG TABS Take 1 tablet by mouth daily. 30 tablet 11 10/03/2020   acetaminophen (TYLENOL) 325 MG tablet Take 2 tablets (650 mg total) by mouth every 4 (four) hours as needed (for pain scale < 4).  100 tablet 0    Benzocaine 15 MG LOZG Use as directed 1 lozenge (15 mg total) in the mouth or throat as needed (for cough). 18 lozenge 1    benzonatate (TESSALON) 100 MG capsule Take 1 capsule (100 mg total) by mouth every 8 (eight) hours. 21 capsule 0    ferrous sulfate 325 (65 FE) MG EC tablet Take 1 tablet (325 mg total) by mouth every other day. 45 tablet 3     Review of Systems  Constitutional: Negative.   Gastrointestinal:  Positive for abdominal pain. Negative for constipation, diarrhea, nausea and vomiting.  Genitourinary: Negative.   Musculoskeletal:  Negative for back pain.  Physical Exam   Blood pressure 99/73, pulse 85, temperature (!) 97.5 F (36.4 C), temperature source Oral, resp. rate 18, height 5\' 3"  (1.6 m), last menstrual period 04/10/2020, SpO2 100 %, unknown if currently breastfeeding.  Physical Exam Vitals and nursing note reviewed. Exam conducted with a chaperone present.  Constitutional:      General: She is not in acute distress.    Appearance: She is well-developed.  HENT:     Head: Normocephalic and atraumatic.  Pulmonary:     Effort: Pulmonary effort is normal. No respiratory distress.  Abdominal:     Palpations: Abdomen is soft.     Tenderness: There is abdominal tenderness in the suprapubic area.  Genitourinary:    Comments: Dilation: Closed Effacement (%): Thick Cervical Position: Posterior Station: Ballotable Exam by:: Judeth Horn NP  Skin:    General: Skin is warm and dry.  Neurological:     Mental Status: She is alert.  Psychiatric:        Mood and Affect: Mood normal.        Behavior: Behavior normal.    MAU Course  Procedures Results for orders placed or performed during the hospital encounter of 10/04/20 (from the past 24 hour(s))  Urinalysis, Routine w reflex microscopic     Status: Abnormal   Collection Time: 10/04/20 10:18 PM  Result Value Ref Range   Color, Urine YELLOW YELLOW   APPearance HAZY (A) CLEAR   Specific  Gravity, Urine 1.018 1.005 - 1.030   pH 6.0 5.0 - 8.0   Glucose, UA NEGATIVE NEGATIVE mg/dL   Hgb urine dipstick NEGATIVE NEGATIVE   Bilirubin Urine NEGATIVE NEGATIVE   Ketones, ur NEGATIVE NEGATIVE mg/dL   Protein, ur NEGATIVE NEGATIVE mg/dL   Nitrite NEGATIVE NEGATIVE   Leukocytes,Ua LARGE (A) NEGATIVE   RBC / HPF 0-5 0 - 5 RBC/hpf   WBC, UA 6-10 0 - 5 WBC/hpf   Bacteria, UA RARE (A) NONE SEEN   Squamous Epithelial / LPF 6-10 0 - 5   Mucus PRESENT   Wet prep, genital     Status: Abnormal   Collection Time: 10/04/20 11:21 PM   Specimen: Vaginal  Result Value Ref Range   Yeast Wet Prep HPF POC NONE SEEN NONE SEEN   Trich, Wet Prep NONE SEEN NONE SEEN   Clue Cells Wet Prep HPF POC PRESENT (A) NONE SEEN   WBC, Wet Prep HPF POC MANY (A) NONE SEEN   Sperm NONE SEEN     MDM Patient presents with suprapubic pain and no other symptoms.  Fetal heart tones obtained via Doppler.  Cervix is closed and thick.  Urinalysis suspicious for urinary tract infection and pain/tenderness is over the bladder.  No fever.  Given Tylenol, Pyridium, and a dose of Keflex in MAU with moderate improvement in symptoms.  We will send urine for culture and start on oral antibiotics.  Assessment and Plan   1. UTI (urinary tract infection) during pregnancy, second trimester   2. [redacted] weeks gestation of pregnancy    -rx Duricef & pyridium -urine culture pending -reviewed reasons to return to MAU  Judeth Horn 10/04/2020, 10:49 PM

## 2020-10-04 NOTE — MAU Note (Signed)
Pt presents to MAU c/o constant lower abdominal pain and pressure rated 8/10 beginning tonight around 8 pm. Pt endorses +FM, denies vaginal bleeding or LOF.    Vitals: BP: 99/73 HR: 84 SpO2:100% Pain: 8/10 Temp: 97.5 F

## 2020-10-05 LAB — GC/CHLAMYDIA PROBE AMP (~~LOC~~) NOT AT ARMC
Chlamydia: NEGATIVE
Comment: NEGATIVE
Comment: NORMAL
Neisseria Gonorrhea: NEGATIVE

## 2020-10-05 MED ORDER — PHENAZOPYRIDINE HCL 200 MG PO TABS: 200.0000 mg | ORAL_TABLET | Freq: Three times a day (TID) | ORAL | 0 refills | Status: AC | PRN

## 2020-10-05 MED ORDER — CEFADROXIL 500 MG PO CAPS: 500.0000 mg | ORAL_CAPSULE | Freq: Two times a day (BID) | ORAL | 0 refills | Status: AC

## 2020-10-06 LAB — CULTURE, OB URINE: Special Requests: NORMAL

## 2020-10-20 DIAGNOSIS — Z3482 Encounter for supervision of other normal pregnancy, second trimester: Secondary | ICD-10-CM | POA: Diagnosis not present

## 2020-10-20 DIAGNOSIS — O99012 Anemia complicating pregnancy, second trimester: Secondary | ICD-10-CM | POA: Diagnosis not present

## 2020-10-20 DIAGNOSIS — N390 Urinary tract infection, site not specified: Secondary | ICD-10-CM | POA: Diagnosis not present

## 2020-11-15 ENCOUNTER — Other Ambulatory Visit: Payer: Self-pay

## 2020-11-15 ENCOUNTER — Encounter (HOSPITAL_COMMUNITY): Payer: Self-pay | Admitting: Obstetrics and Gynecology

## 2020-11-15 ENCOUNTER — Inpatient Hospital Stay (HOSPITAL_COMMUNITY)
Admission: AD | Admit: 2020-11-15 | Discharge: 2020-11-15 | Disposition: A | Discharge status: Home / Self Care | Payer: Medicaid Other | Attending: Obstetrics and Gynecology | Admitting: Obstetrics and Gynecology

## 2020-11-15 DIAGNOSIS — O9A213 Injury, poisoning and certain other consequences of external causes complicating pregnancy, third trimester: Secondary | ICD-10-CM | POA: Diagnosis not present

## 2020-11-15 DIAGNOSIS — W1830XA Fall on same level, unspecified, initial encounter: Secondary | ICD-10-CM | POA: Insufficient documentation

## 2020-11-15 DIAGNOSIS — S93492A Sprain of other ligament of left ankle, initial encounter: Secondary | ICD-10-CM | POA: Insufficient documentation

## 2020-11-15 DIAGNOSIS — O9A219 Injury, poisoning and certain other consequences of external causes complicating pregnancy, unspecified trimester: Secondary | ICD-10-CM | POA: Diagnosis not present

## 2020-11-15 DIAGNOSIS — S93402A Sprain of unspecified ligament of left ankle, initial encounter: Secondary | ICD-10-CM

## 2020-11-15 DIAGNOSIS — Z3A28 28 weeks gestation of pregnancy: Secondary | ICD-10-CM

## 2020-11-15 MED ORDER — ACETAMINOPHEN 500 MG PO TABS
1000.0000 mg | ORAL_TABLET | Freq: Four times a day (QID) | ORAL | Status: DC | PRN
  Administered 2020-11-15: 1000 mg via ORAL
  Filled 2020-11-15: qty 2

## 2020-11-15 NOTE — Progress Notes (Signed)
Orthopedic Tech Progress Note Patient Details:  Gloria Kent Jun 09, 1990 [id number]  Ortho Devices Type of Ortho Device: ASO Ortho Device/Splint Location: LLE Ortho Device/Splint Interventions: Ordered, Application, Adjustment   Post Interventions Patient Tolerated: Well Instructions Provided: Care of device  Donald Pore 11/15/2020, 2:47 PM

## 2020-11-15 NOTE — Discharge Instructions (Signed)
[name],  It appears that you have sprained your left ankle.  We are giving you an ankle brace to wear for the next 2 to 4 weeks.  You do not need to wear this when you are sleeping or just sitting around the house.  However if you are in the community, walking around, at work, please wear this for at least 2 weeks.  If you feel better after 2 weeks, you may stop wearing it.  When you are not wearing the brace, I recommend keeping ice on the outside of your ankle to help with the swelling.  You may take Tylenol 1000 mg every 6 hours as needed for pain.  Dr. Marisue Humble

## 2020-11-15 NOTE — MAU Provider Note (Addendum)
History     CSN: [id number]  Arrival date and time: 11/15/20 1326   Event Date/Time  11/15/20 1200  First Provider Initiated Contact with Patient 11/15/20 1356       Chief Complaint  Patient presents with   Fall   Fall Patient had a mechanical fall while walking to her car 2 hours ago. She fell on her left side, intentionally trying to avoid hitting her abdomen. Denies any abdominal trauma or pain. She is able to bear weight on the ankle, but walking is painful. Pain is worst along posterior aspect of lateral malleolus but with no bony tenderness. She has not taken any medication for pain. She has full range of motion, though internal rotation and dorsiflexion are painful.  Denies any syncope/pre-syncope.   She denies any leaking of fluid, vaginal bleeding. She does endorse fetal movement both before and after the event.   OB History     Gravida  2   Para  1   Term  1   Preterm      AB      Living  1      SAB      IAB      Ectopic      Multiple  0   Live Births  1           Past Medical History:  Diagnosis Date   Dysrhythmia 10/02/11   "lately heart is beating faster"   Headache(784.0) 10/02/11   "weekly"   Pleural effusion 10/02/11   TB (pulmonary tuberculosis) questionable   hospitalized in 2014 treated until negative    Past Surgical History:  Procedure Laterality Date   PLEURAL EFFUSION DRAINAGE  10/04/2011   Procedure: DRAINAGE OF PLEURAL EFFUSION;  Surgeon: Alleen Borne, MD;  Location: [facility] OR;  Service: Thoracic;  Laterality: Left;    Family History  Problem Relation Age of Onset   Hypertension Maternal Grandmother     Social History   Tobacco Use   Smoking status: Never   Smokeless tobacco: Never  Vaping Use   Vaping Use: Never used  Substance Use Topics   Alcohol use: No   Drug use: No    Allergies: No Known Allergies  Medications Prior to Admission  Medication Sig Dispense Refill Last Dose   acetaminophen (TYLENOL) 325 MG  tablet Take 2 tablets (650 mg total) by mouth every 4 (four) hours as needed (for pain scale < 4). 100 tablet 0    Benzocaine 15 MG LOZG Use as directed 1 lozenge (15 mg total) in the mouth or throat as needed (for cough). 18 lozenge 1    benzonatate (TESSALON) 100 MG capsule Take 1 capsule (100 mg total) by mouth every 8 (eight) hours. 21 capsule 0    ferrous sulfate 325 (65 FE) MG EC tablet Take 1 tablet (325 mg total) by mouth every other day. 45 tablet 3    Prenatal Vit-DSS-Fe Fum-FA (PRENATAL 19) 29-1 MG TABS Take 1 tablet by mouth daily. 30 tablet 11     Review of Systems  Gastrointestinal:  Negative for abdominal pain.  Genitourinary:  Negative for pelvic pain, vaginal bleeding, vaginal discharge and vaginal pain.  Musculoskeletal:  Positive for arthralgias and joint swelling.  Neurological:  Negative for syncope, weakness and light-headedness.  Physical Exam   Blood pressure 116/69, pulse 100, temperature 97.6 F (36.4 C), resp. rate 20, last menstrual period 04/10/2020, SpO2 99 %, unknown if currently breastfeeding.  Physical Exam Feet:  Comments: L Ankle with edema of lateral aspect, no bruising or bony deformity.  No bony tenderness over malleoli, navicular, or base of fifth metatarsal.  FROM passively, active ROM limited by pain with internal rotation and dorsiflexion.  DP and PT pulses 2+. Distal sensation intact.    Fetal Well Being: Reactive NST 140/moderate/accelerations present  MAU Course  Procedures  MDM Patient presented after a fall at home with internal rotation of her left ankle. Not meeting [place] ankle criteria, therefore no need for X Ray ankle.  Fetal Heart Tones with Cat I tracing, together with + fetal movement, no LOF or vaginal bleeding, no indication for abdominal US or further workup.   Assessment and Plan  L Ankle Sprain - Immobilizer placed by ortho tech - Recommend RICE therapy - Activity as tolerated - Patient may follow-up with her  primary care physician   Pregnancy Reactive NST. No LOF, bleeding. + Fetal movement.  - No further workup or intervention at this time.   Gloria Kent 11/15/2020, 2:28 PM   Attestation of Supervision of Student:  I confirm that I have verified the information documented in the  resident 's note and that I have also personally reperformed the history, physical exam and all medical decision making activities.  I have verified that all services and findings are accurately documented in this student's note; and I agree with management and plan as outlined in the documentation. I have also made any necessary editorial changes.  Advised to wear immobilizer when walking on uneven surfaces or long periods of time. Ok to not wear while sleeping or showering. May take Tylenol for pain relief. RICE was emphasized. Keep OB appt as scheduled.  Raelyn Mora, CNM [facility] [employer], [facility] 11/15/2020 5:07 PM

## 2020-11-15 NOTE — MAU Note (Signed)
Pt reports she was going to the car at 1210 and she tripped over something and twisted her left foot. Pt denies hitting her abdomen.   Pt reports feeling movement before the fall, but is now not feeling any movement since then.  Denies vaginal bleeding or LOF.

## 2020-11-17 DIAGNOSIS — Z3483 Encounter for supervision of other normal pregnancy, third trimester: Secondary | ICD-10-CM | POA: Diagnosis not present

## 2020-11-23 DIAGNOSIS — Z3483 Encounter for supervision of other normal pregnancy, third trimester: Secondary | ICD-10-CM | POA: Diagnosis not present

## 2020-11-23 DIAGNOSIS — Z23 Encounter for immunization: Secondary | ICD-10-CM | POA: Diagnosis not present

## 2020-12-28 ENCOUNTER — Inpatient Hospital Stay (HOSPITAL_COMMUNITY)
Admission: AD | Admit: 2020-12-28 | Discharge: 2020-12-29 | Disposition: A | Discharge status: Home / Self Care | Payer: Medicaid Other | Attending: Obstetrics & Gynecology | Admitting: Obstetrics & Gynecology

## 2020-12-28 ENCOUNTER — Encounter (HOSPITAL_COMMUNITY): Payer: Self-pay | Admitting: Obstetrics & Gynecology

## 2020-12-28 DIAGNOSIS — Z3A34 34 weeks gestation of pregnancy: Secondary | ICD-10-CM | POA: Diagnosis not present

## 2020-12-28 DIAGNOSIS — O4703 False labor before 37 completed weeks of gestation, third trimester: Secondary | ICD-10-CM | POA: Diagnosis not present

## 2020-12-28 DIAGNOSIS — O479 False labor, unspecified: Secondary | ICD-10-CM

## 2020-12-28 DIAGNOSIS — M549 Dorsalgia, unspecified: Secondary | ICD-10-CM | POA: Diagnosis not present

## 2020-12-28 DIAGNOSIS — O322XX0 Maternal care for transverse and oblique lie, not applicable or unspecified: Secondary | ICD-10-CM

## 2020-12-28 DIAGNOSIS — O26893 Other specified pregnancy related conditions, third trimester: Secondary | ICD-10-CM | POA: Insufficient documentation

## 2020-12-28 DIAGNOSIS — O99891 Other specified diseases and conditions complicating pregnancy: Secondary | ICD-10-CM

## 2020-12-28 LAB — URINALYSIS, ROUTINE W REFLEX MICROSCOPIC
Bilirubin Urine: NEGATIVE
Glucose, UA: NEGATIVE mg/dL
Hgb urine dipstick: NEGATIVE
Ketones, ur: NEGATIVE mg/dL
Nitrite: NEGATIVE
Protein, ur: NEGATIVE mg/dL
Specific Gravity, Urine: 1.004 — ABNORMAL LOW (ref 1.005–1.030)
pH: 7 (ref 5.0–8.0)

## 2020-12-28 MED ORDER — CYCLOBENZAPRINE HCL 5 MG PO TABS
5.0000 mg | ORAL_TABLET | Freq: Once | ORAL | Status: AC
  Administered 2020-12-28: 5 mg via ORAL
  Filled 2020-12-28: qty 1

## 2020-12-28 NOTE — MAU Note (Signed)
..  Gloria Kent is a 30 y.o. at [redacted]w[redacted]d here in MAU reporting: abdominal and back pain since last night. She describes the pain as intermittent, "feels like her belly is going to explode." She thinks the pain may be similar to contractions but not sure since she had an epidural with her first baby.  Denies vaginal bleeding or leaking of fluid. +FM.  Pain score: 10/10 Vitals:   12/28/20 2234  BP: 115/76  Pulse: (!) 104  Resp: 20  Temp: 98 F (36.7 C)  SpO2: 100%     FHT: 144 Lab orders placed from triage: UA

## 2020-12-28 NOTE — MAU Provider Note (Signed)
Chief Complaint:  Abdominal Pain   Event Date/Time   First Provider Initiated Contact with Patient 12/28/20 2328     HPI: Gloria Kent is a 30 y.o. G2P1001 at 55w1dwho presents to maternity admissions reporting intermittent upper and lower abdominal pain.  States it "feels like my belly is exploding".  States they feel like contractions.  Has not had any pain since arriving here.   States the pain occurred earlier.  Requests ultrasound.  Is concerned her baby has turned around. . She reports good fetal movement, denies LOF, vaginal bleeding, vaginal itching/burning, urinary symptoms, h/a, dizziness, n/v, diarrhea, constipation or fever/chills.    Abdominal Pain This is a new problem. The current episode started today. The onset quality is sudden. The problem occurs intermittently. The problem has been resolved. The pain is located in the epigastric region, RLQ and LLQ. The quality of the pain is sharp and tearing. The abdominal pain radiates to the back. Pertinent negatives include no constipation, diarrhea, dysuria, fever, frequency, myalgias, nausea or vomiting. Nothing aggravates the pain. The pain is relieved by Nothing. She has tried nothing for the symptoms.   RN note: Gloria Kent is a 30 y.o. at [redacted]w[redacted]d here in MAU reporting: abdominal and back pain since last night. She describes the pain as intermittent, "feels like her belly is going to explode." She thinks the pain may be similar to contractions but not sure since she had an epidural with her first baby.  Denies vaginal bleeding or leaking of fluid. +FM.  Past Medical History: Past Medical History:  Diagnosis Date   Dysrhythmia 10/02/11   "lately heart is beating faster"   Headache(784.0) 10/02/11   "weekly"   Pleural effusion 10/02/11   TB (pulmonary tuberculosis) questionable   hospitalized in 2014 treated until negative    Past obstetric history: OB History  Gravida Para Term Preterm AB Living  2 1 1     1   SAB  IAB Ectopic Multiple Live Births        0 1    # Outcome Date GA Lbr Len/2nd Weight Sex Delivery Anes PTL Lv  2 Current           1 Term 11/04/17 [redacted]w[redacted]d 47:49 / 07:03 3785 g F Vag-Vacuum EPI  LIV    Past Surgical History: Past Surgical History:  Procedure Laterality Date   PLEURAL EFFUSION DRAINAGE  10/04/2011   Procedure: DRAINAGE OF PLEURAL EFFUSION;  Surgeon: 10/06/2011, MD;  Location: [facility] OR;  Service: Thoracic;  Laterality: Left;    Family History: Family History  Problem Relation Age of Onset   Hypertension Maternal Grandmother     Social History: Social History   Tobacco Use   Smoking status: Never   Smokeless tobacco: Never  Vaping Use   Vaping Use: Never used  Substance Use Topics   Alcohol use: No   Drug use: No    Allergies: No Known Allergies  Meds:  Medications Prior to Admission  Medication Sig Dispense Refill Last Dose   Prenatal Vit-DSS-Fe Fum-FA (PRENATAL 19) 29-1 MG TABS Take 1 tablet by mouth daily. 30 tablet 11 12/27/2020   acetaminophen (TYLENOL) 325 MG tablet Take 2 tablets (650 mg total) by mouth every 4 (four) hours as needed (for pain scale < 4). 100 tablet 0    ferrous sulfate 325 (65 FE) MG EC tablet Take 1 tablet (325 mg total) by mouth every other day. 45 tablet 3 Unknown    I have reviewed  patient's Past Medical Hx, Surgical Hx, Family Hx, Social Hx, medications and allergies.   ROS:  Review of Systems  Constitutional:  Negative for fever.  Gastrointestinal:  Positive for abdominal pain. Negative for constipation, diarrhea, nausea and vomiting.  Genitourinary:  Negative for dysuria and frequency.  Musculoskeletal:  Negative for myalgias.  Other systems negative  Physical Exam  Patient Vitals for the past 24 hrs:  BP Temp Temp src Pulse Resp SpO2 Height Weight  12/28/20 2234 115/76 98 F (36.7 C) Oral (!) 104 20 100 % 5\' 3"  (1.6 m) 75.8 kg   Constitutional: Well-developed, well-nourished female in no acute distress.   Cardiovascular: normal rate  Respiratory: normal effort GI: Abd soft, non-tender, gravid appropriate for gestational age.   No rebound or guarding. MS: Extremities nontender, no edema, normal ROM Neurologic: Alert and oriented x 4.  GU: Neg CVAT.  PELVIC EXAM: Dilation: Closed Effacement (%): Thick Cervical Position: Posterior Station: Ballotable Presentation: Transverse Exam by:: [email or web address]  FHT:  Baseline 140 , moderate variability, accelerations present, no decelerations Contractions:  Irregular    Labs: Results for orders placed or performed during the hospital encounter of 12/28/20 (from the past 24 hour(s))  Urinalysis, Routine w reflex microscopic Urine, Clean Catch     Status: Abnormal   Collection Time: 12/28/20 11:02 PM  Result Value Ref Range   Color, Urine STRAW (A) YELLOW   APPearance CLEAR CLEAR   Specific Gravity, Urine 1.004 (L) 1.005 - 1.030   pH 7.0 5.0 - 8.0   Glucose, UA NEGATIVE NEGATIVE mg/dL   Hgb urine dipstick NEGATIVE NEGATIVE   Bilirubin Urine NEGATIVE NEGATIVE   Ketones, ur NEGATIVE NEGATIVE mg/dL   Protein, ur NEGATIVE NEGATIVE mg/dL   Nitrite NEGATIVE NEGATIVE   Leukocytes,Ua TRACE (A) NEGATIVE   RBC / HPF 0-5 0 - 5 RBC/hpf   WBC, UA 0-5 0 - 5 WBC/hpf   Bacteria, UA RARE (A) NONE SEEN   Squamous Epithelial / LPF 0-5 0 - 5   Mucus PRESENT     Imaging:  Pt informed that the ultrasound is considered a limited OB ultrasound and is not intended to be a complete ultrasound exam.  Patient also informed that the ultrasound is not being completed with the intent of assessing for fetal or placental anomalies or any pelvic abnormalities.  Explained that the purpose of today's ultrasound is to assess for presentation.  Patient acknowledges the purpose of the exam and the limitations of the study.  Fetus is found to be in the transverse lie with head to maternal right.    MAU Course/MDM: I have ordered Flexeril for her back pain NST reviewed,  reactive Consult Dr 12/30/20 with presentation, exam findings. He states the [facility] Department can reassess fetal presentation at her next visit. Treatments in MAU included EFM, Flexeril (which did relieve her back pain).    Assessment: Single IUP at [redacted]w[redacted]d Intermittent contractions, now resolved Back pain in pregnancy  Plan: Discharge home Preterm Labor precautions and fetal kick counts Follow up in Office for prenatal visits and recheck of presentation Encouraged to return if she develops worsening of symptoms, increase in pain, fever, or other concerning symptoms.   Pt stable at time of discharge.  [redacted]w[redacted]d CNM, MSN Certified Nurse-Midwife 12/28/2020 11:28 PM

## 2021-01-05 DIAGNOSIS — O99013 Anemia complicating pregnancy, third trimester: Secondary | ICD-10-CM | POA: Diagnosis not present

## 2021-01-05 DIAGNOSIS — Z3483 Encounter for supervision of other normal pregnancy, third trimester: Secondary | ICD-10-CM | POA: Diagnosis not present

## 2021-01-12 DIAGNOSIS — Z23 Encounter for immunization: Secondary | ICD-10-CM | POA: Diagnosis not present

## 2021-01-12 DIAGNOSIS — Z3483 Encounter for supervision of other normal pregnancy, third trimester: Secondary | ICD-10-CM | POA: Diagnosis not present

## 2021-01-12 DIAGNOSIS — Z789 Other specified health status: Secondary | ICD-10-CM | POA: Diagnosis not present

## 2021-01-12 DIAGNOSIS — O99013 Anemia complicating pregnancy, third trimester: Secondary | ICD-10-CM | POA: Diagnosis not present

## 2021-01-12 DIAGNOSIS — A150 Tuberculosis of lung: Secondary | ICD-10-CM | POA: Diagnosis not present

## 2021-01-12 DIAGNOSIS — N390 Urinary tract infection, site not specified: Secondary | ICD-10-CM | POA: Diagnosis not present

## 2021-01-12 LAB — OB RESULTS CONSOLE GBS: GBS: NEGATIVE

## 2021-01-24 DIAGNOSIS — O99212 Obesity complicating pregnancy, second trimester: Secondary | ICD-10-CM | POA: Diagnosis not present

## 2021-01-24 DIAGNOSIS — O283 Abnormal ultrasonic finding on antenatal screening of mother: Secondary | ICD-10-CM | POA: Diagnosis not present

## 2021-01-30 DIAGNOSIS — O99013 Anemia complicating pregnancy, third trimester: Secondary | ICD-10-CM | POA: Diagnosis not present

## 2021-01-30 DIAGNOSIS — Z3483 Encounter for supervision of other normal pregnancy, third trimester: Secondary | ICD-10-CM | POA: Diagnosis not present

## 2021-01-31 ENCOUNTER — Other Ambulatory Visit: Payer: Self-pay

## 2021-01-31 ENCOUNTER — Inpatient Hospital Stay (HOSPITAL_COMMUNITY)
Admission: AD | Admit: 2021-01-31 | Discharge: 2021-01-31 | Disposition: A | Discharge status: Home / Self Care | Payer: Medicaid Other | Attending: Obstetrics and Gynecology | Admitting: Obstetrics and Gynecology

## 2021-01-31 ENCOUNTER — Encounter (HOSPITAL_COMMUNITY): Payer: Self-pay | Admitting: Obstetrics and Gynecology

## 2021-01-31 DIAGNOSIS — Z3A00 Weeks of gestation of pregnancy not specified: Secondary | ICD-10-CM

## 2021-01-31 DIAGNOSIS — O479 False labor, unspecified: Secondary | ICD-10-CM

## 2021-01-31 DIAGNOSIS — Z3483 Encounter for supervision of other normal pregnancy, third trimester: Secondary | ICD-10-CM | POA: Diagnosis not present

## 2021-01-31 NOTE — MAU Note (Signed)
PT SAYS UC STRONG SINCE 10PM.  VE  2 CM AT HD  DENIES HSV GBS- NEG

## 2021-01-31 NOTE — MAU Provider Note (Signed)
S: Patient is here for RN labor evaluation. Fetal tracing, vital signs, & chart reviewed   O:  Vitals:   01/31/21 0108  BP: 119/78  Pulse: (!) 115  Resp: 20  Temp: 98.5 F (36.9 C)  Weight: 76.6 kg  Height: 5\' 2"  (1.575 m)   No results found for this or any previous visit (from the past 24 hour(s)).  Dilation: 2 Effacement (%): Thick Exam by:: [email or web address] Patient checked at [facility] earlier today and found to be 2cm. Here 2cm (maybe a little less per RN check)  FHR: 140 bpm, Mod Var, no Decels, + Accels UC: irregular, every 7-10 min NST reviewed and reactive.  A: 1. False labor    Patient offered recheck at 1.5 hrs from initial check and monitoring. No regular contractions on monitor. Patient opted to not be rechecked and go home as she is no longer feeling contractions since being here. Monitor with very sporadic contractions so discussed with RN it is ok to DC patient without recheck.   P:  RN to discharge home in stable condition with return precautions & fetal kick counts  [facility], MD, MPH OB Fellow, Faculty Practice

## 2021-02-05 ENCOUNTER — Other Ambulatory Visit: Payer: Self-pay

## 2021-02-05 ENCOUNTER — Inpatient Hospital Stay (HOSPITAL_COMMUNITY): Payer: Medicaid Other | Admitting: Anesthesiology

## 2021-02-05 ENCOUNTER — Encounter (HOSPITAL_COMMUNITY)
Admission: AD | Disposition: A | Discharge status: Home / Self Care | Payer: Self-pay | Source: Home / Self Care | Attending: Obstetrics and Gynecology

## 2021-02-05 ENCOUNTER — Inpatient Hospital Stay (HOSPITAL_COMMUNITY)
Admission: AD | Admit: 2021-02-05 | Discharge: 2021-02-08 | DRG: 786 | Disposition: A | Discharge status: Home / Self Care | Payer: Medicaid Other | Attending: Obstetrics and Gynecology | Admitting: Obstetrics and Gynecology

## 2021-02-05 ENCOUNTER — Encounter (HOSPITAL_COMMUNITY): Payer: Self-pay | Admitting: Obstetrics and Gynecology

## 2021-02-05 DIAGNOSIS — O9852 Other viral diseases complicating childbirth: Secondary | ICD-10-CM | POA: Diagnosis present

## 2021-02-05 DIAGNOSIS — O328XX0 Maternal care for other malpresentation of fetus, not applicable or unspecified: Principal | ICD-10-CM | POA: Diagnosis present

## 2021-02-05 DIAGNOSIS — U071 COVID-19: Secondary | ICD-10-CM | POA: Diagnosis present

## 2021-02-05 DIAGNOSIS — O34219 Maternal care for unspecified type scar from previous cesarean delivery: Secondary | ICD-10-CM | POA: Diagnosis present

## 2021-02-05 DIAGNOSIS — Z3A39 39 weeks gestation of pregnancy: Secondary | ICD-10-CM | POA: Diagnosis not present

## 2021-02-05 DIAGNOSIS — Z9889 Other specified postprocedural states: Secondary | ICD-10-CM

## 2021-02-05 DIAGNOSIS — O322XX0 Maternal care for transverse and oblique lie, not applicable or unspecified: Secondary | ICD-10-CM | POA: Diagnosis not present

## 2021-02-05 HISTORY — PX: EXTERNAL CEPHALIC VERSION: OBO2

## 2021-02-05 LAB — TYPE AND SCREEN
ABO/RH(D): O POS
Antibody Screen: NEGATIVE

## 2021-02-05 LAB — CBC
HCT: 39.8 % (ref 36.0–46.0)
Hemoglobin: 12.4 g/dL (ref 12.0–15.0)
MCH: 24.3 pg — ABNORMAL LOW (ref 26.0–34.0)
MCHC: 31.2 g/dL (ref 30.0–36.0)
MCV: 78 fL — ABNORMAL LOW (ref 80.0–100.0)
Platelets: 269 10*3/uL (ref 150–400)
RBC: 5.1 MIL/uL (ref 3.87–5.11)
RDW: 15.4 % (ref 11.5–15.5)
WBC: 6.6 10*3/uL (ref 4.0–10.5)
nRBC: 0 % (ref 0.0–0.2)

## 2021-02-05 LAB — RESP PANEL BY RT-PCR (FLU A&B, COVID) ARPGX2
Influenza A by PCR: NEGATIVE
Influenza B by PCR: NEGATIVE
SARS Coronavirus 2 by RT PCR: POSITIVE — AB

## 2021-02-05 LAB — CREATININE, SERUM
Creatinine, Ser: 0.95 mg/dL (ref 0.44–1.00)
GFR, Estimated: >60 mL/min

## 2021-02-05 SURGERY — Surgical Case
Anesthesia: Spinal

## 2021-02-05 MED ORDER — DEXAMETHASONE SODIUM PHOSPHATE 4 MG/ML IJ SOLN
INTRAMUSCULAR | Status: DC | PRN
  Administered 2021-02-05: 8 mg via INTRAVENOUS

## 2021-02-05 MED ORDER — TETANUS-DIPHTH-ACELL PERTUSSIS 5-2.5-18.5 LF-MCG/0.5 IM SUSY: 0.5000 mL | PREFILLED_SYRINGE | Freq: Once | INTRAMUSCULAR | Status: DC

## 2021-02-05 MED ORDER — DIPHENHYDRAMINE HCL 25 MG PO CAPS: 25.0000 mg | ORAL_CAPSULE | Freq: Four times a day (QID) | ORAL | Status: DC | PRN

## 2021-02-05 MED ORDER — KETOROLAC TROMETHAMINE 30 MG/ML IJ SOLN
INTRAMUSCULAR | Status: AC
  Filled 2021-02-05: qty 1

## 2021-02-05 MED ORDER — SODIUM CHLORIDE 0.9 % IV SOLN
INTRAVENOUS | Status: DC | PRN
  Administered 2021-02-05: 18:00:00 500 mg via INTRAVENOUS

## 2021-02-05 MED ORDER — NALOXONE HCL 0.4 MG/ML IJ SOLN: 0.4000 mg | INTRAMUSCULAR | Status: DC | PRN

## 2021-02-05 MED ORDER — IBUPROFEN 600 MG PO TABS
600.0000 mg | ORAL_TABLET | Freq: Four times a day (QID) | ORAL | Status: DC
  Administered 2021-02-06 – 2021-02-08 (×9): 600 mg via ORAL
  Filled 2021-02-05 (×10): qty 1

## 2021-02-05 MED ORDER — OXYTOCIN-SODIUM CHLORIDE 30-0.9 UT/500ML-% IV SOLN
2.5000 [IU]/h | INTRAVENOUS | Status: AC
  Administered 2021-02-06: 2.5 [IU]/h via INTRAVENOUS
  Filled 2021-02-05: qty 500

## 2021-02-05 MED ORDER — ZOLPIDEM TARTRATE 5 MG PO TABS: 5.0000 mg | ORAL_TABLET | Freq: Every evening | ORAL | Status: DC | PRN

## 2021-02-05 MED ORDER — NALBUPHINE HCL 10 MG/ML IJ SOLN: 5.0000 mg | Freq: Once | INTRAMUSCULAR | Status: DC | PRN

## 2021-02-05 MED ORDER — ONDANSETRON HCL 4 MG/2ML IJ SOLN
4.0000 mg | Freq: Four times a day (QID) | INTRAMUSCULAR | Status: DC | PRN
  Administered 2021-02-05: 18:00:00 4 mg via INTRAVENOUS

## 2021-02-05 MED ORDER — ACETAMINOPHEN 500 MG PO TABS
1000.0000 mg | ORAL_TABLET | Freq: Four times a day (QID) | ORAL | Status: AC
  Administered 2021-02-06 (×2): 1000 mg via ORAL
  Filled 2021-02-05 (×3): qty 2

## 2021-02-05 MED ORDER — PHENYLEPHRINE 40 MCG/ML (10ML) SYRINGE FOR IV PUSH (FOR BLOOD PRESSURE SUPPORT)
80.0000 ug | PREFILLED_SYRINGE | INTRAVENOUS | Status: DC | PRN
  Filled 2021-02-05 (×2): qty 10

## 2021-02-05 MED ORDER — SIMETHICONE 80 MG PO CHEW
80.0000 mg | CHEWABLE_TABLET | Freq: Three times a day (TID) | ORAL | Status: DC
  Administered 2021-02-06 – 2021-02-08 (×8): 80 mg via ORAL
  Filled 2021-02-05 (×8): qty 1

## 2021-02-05 MED ORDER — LIDOCAINE-EPINEPHRINE (PF) 2 %-1:200000 IJ SOLN
INTRAMUSCULAR | Status: DC | PRN
  Administered 2021-02-05: 3 mL via EPIDURAL
  Administered 2021-02-05: 5 mL via EPIDURAL

## 2021-02-05 MED ORDER — FENTANYL CITRATE (PF) 100 MCG/2ML IJ SOLN: 25.0000 ug | INTRAMUSCULAR | Status: DC | PRN

## 2021-02-05 MED ORDER — LIDOCAINE HCL (PF) 1 % IJ SOLN: 30.0000 mL | INTRAMUSCULAR | Status: DC | PRN

## 2021-02-05 MED ORDER — KETOROLAC TROMETHAMINE 30 MG/ML IJ SOLN: 30.0000 mg | Freq: Four times a day (QID) | INTRAMUSCULAR | Status: AC | PRN

## 2021-02-05 MED ORDER — ACETAMINOPHEN 325 MG PO TABS
650.0000 mg | ORAL_TABLET | ORAL | Status: DC | PRN
Start: 2021-02-05 — End: 2021-02-05

## 2021-02-05 MED ORDER — FENTANYL CITRATE (PF) 100 MCG/2ML IJ SOLN
INTRAMUSCULAR | Status: DC | PRN
  Administered 2021-02-05: 15 ug via INTRATHECAL

## 2021-02-05 MED ORDER — TRANEXAMIC ACID-NACL 1000-0.7 MG/100ML-% IV SOLN
INTRAVENOUS | Status: DC | PRN
  Administered 2021-02-05: 1000 mg via INTRAVENOUS

## 2021-02-05 MED ORDER — NALOXONE HCL 4 MG/10ML IJ SOLN
1.0000 ug/kg/h | INTRAVENOUS | Status: DC | PRN
  Filled 2021-02-05: qty 5

## 2021-02-05 MED ORDER — KETOROLAC TROMETHAMINE 30 MG/ML IJ SOLN
30.0000 mg | Freq: Once | INTRAMUSCULAR | Status: AC
  Administered 2021-02-05: 20:00:00 30 mg via INTRAVENOUS

## 2021-02-05 MED ORDER — OXYCODONE HCL 5 MG PO TABS
5.0000 mg | ORAL_TABLET | ORAL | Status: DC | PRN
  Administered 2021-02-06 – 2021-02-08 (×5): 5 mg via ORAL
  Filled 2021-02-05 (×5): qty 1

## 2021-02-05 MED ORDER — SENNOSIDES-DOCUSATE SODIUM 8.6-50 MG PO TABS
2.0000 | ORAL_TABLET | Freq: Every evening | ORAL | Status: DC | PRN
  Administered 2021-02-06: 11:00:00 2 via ORAL
  Filled 2021-02-05: qty 2

## 2021-02-05 MED ORDER — CEFAZOLIN SODIUM-DEXTROSE 2-3 GM-%(50ML) IV SOLR
INTRAVENOUS | Status: DC | PRN
Start: 2021-02-05 — End: 2021-02-05
  Administered 2021-02-05: 2 g via INTRAVENOUS

## 2021-02-05 MED ORDER — DEXAMETHASONE SODIUM PHOSPHATE 4 MG/ML IJ SOLN
INTRAMUSCULAR | Status: AC
  Filled 2021-02-05: qty 1

## 2021-02-05 MED ORDER — OXYCODONE-ACETAMINOPHEN 5-325 MG PO TABS
1.0000 | ORAL_TABLET | ORAL | Status: DC | PRN
Start: 2021-02-05 — End: 2021-02-05

## 2021-02-05 MED ORDER — SODIUM CHLORIDE 0.9 % IV SOLN
INTRAVENOUS | Status: AC
  Filled 2021-02-05: qty 500

## 2021-02-05 MED ORDER — LACTATED RINGERS IV SOLN
INTRAVENOUS | Status: DC | PRN
Start: 2021-02-05 — End: 2021-02-05

## 2021-02-05 MED ORDER — SODIUM CHLORIDE 0.9% FLUSH: 3.0000 mL | INTRAVENOUS | Status: DC | PRN

## 2021-02-05 MED ORDER — METOCLOPRAMIDE HCL 5 MG/ML IJ SOLN
INTRAMUSCULAR | Status: AC
  Filled 2021-02-05: qty 2

## 2021-02-05 MED ORDER — EPHEDRINE 5 MG/ML INJ
10.0000 mg | INTRAVENOUS | Status: DC | PRN
  Filled 2021-02-05: qty 2

## 2021-02-05 MED ORDER — FENTANYL CITRATE (PF) 100 MCG/2ML IJ SOLN
INTRAMUSCULAR | Status: AC
  Filled 2021-02-05: qty 2

## 2021-02-05 MED ORDER — METOCLOPRAMIDE HCL 5 MG/ML IJ SOLN
INTRAMUSCULAR | Status: DC | PRN
  Administered 2021-02-05: 10 mg via INTRAVENOUS

## 2021-02-05 MED ORDER — FENTANYL-BUPIVACAINE-NACL 0.5-0.125-0.9 MG/250ML-% EP SOLN
12.0000 mL/h | EPIDURAL | Status: DC | PRN
  Filled 2021-02-05: qty 250

## 2021-02-05 MED ORDER — MORPHINE SULFATE (PF) 0.5 MG/ML IJ SOLN
INTRAMUSCULAR | Status: AC
  Filled 2021-02-05: qty 10

## 2021-02-05 MED ORDER — LACTATED RINGERS IV SOLN: 500.0000 mL | Freq: Once | INTRAVENOUS | Status: DC

## 2021-02-05 MED ORDER — MORPHINE SULFATE (PF) 0.5 MG/ML IJ SOLN
INTRAMUSCULAR | Status: DC | PRN
  Administered 2021-02-05: 3 mg via EPIDURAL

## 2021-02-05 MED ORDER — OXYTOCIN-SODIUM CHLORIDE 30-0.9 UT/500ML-% IV SOLN: 2.5000 [IU]/h | INTRAVENOUS | Status: DC

## 2021-02-05 MED ORDER — TERBUTALINE SULFATE 1 MG/ML IJ SOLN
0.2500 mg | Freq: Once | INTRAMUSCULAR | Status: AC
  Administered 2021-02-05: 16:00:00 0.25 mg via SUBCUTANEOUS

## 2021-02-05 MED ORDER — OXYTOCIN BOLUS FROM INFUSION: 333.0000 mL | Freq: Once | INTRAVENOUS | Status: DC

## 2021-02-05 MED ORDER — MENTHOL 3 MG MT LOZG: 1.0000 | LOZENGE | OROMUCOSAL | Status: DC | PRN

## 2021-02-05 MED ORDER — FLEET ENEMA 7-19 GM/118ML RE ENEM: 1.0000 | ENEMA | RECTAL | Status: DC | PRN

## 2021-02-05 MED ORDER — TERBUTALINE SULFATE 1 MG/ML IJ SOLN
INTRAMUSCULAR | Status: AC
  Filled 2021-02-05: qty 1

## 2021-02-05 MED ORDER — TRANEXAMIC ACID-NACL 1000-0.7 MG/100ML-% IV SOLN
INTRAVENOUS | Status: AC
  Filled 2021-02-05: qty 100

## 2021-02-05 MED ORDER — OXYTOCIN-SODIUM CHLORIDE 30-0.9 UT/500ML-% IV SOLN
INTRAVENOUS | Status: DC | PRN
  Administered 2021-02-05: 200 mL via INTRAVENOUS

## 2021-02-05 MED ORDER — OXYTOCIN-SODIUM CHLORIDE 30-0.9 UT/500ML-% IV SOLN
INTRAVENOUS | Status: AC
  Filled 2021-02-05: qty 500

## 2021-02-05 MED ORDER — ACETAMINOPHEN 500 MG PO TABS: 1000.0000 mg | ORAL_TABLET | Freq: Once | ORAL | Status: DC

## 2021-02-05 MED ORDER — PROMETHAZINE HCL 25 MG/ML IJ SOLN: 6.2500 mg | INTRAMUSCULAR | Status: DC | PRN

## 2021-02-05 MED ORDER — LACTATED RINGERS IV SOLN: INTRAVENOUS | Status: DC

## 2021-02-05 MED ORDER — ONDANSETRON HCL 4 MG/2ML IJ SOLN: 4.0000 mg | Freq: Three times a day (TID) | INTRAMUSCULAR | Status: DC | PRN

## 2021-02-05 MED ORDER — DIPHENHYDRAMINE HCL 50 MG/ML IJ SOLN: 12.5000 mg | Freq: Four times a day (QID) | INTRAMUSCULAR | Status: DC | PRN

## 2021-02-05 MED ORDER — ENOXAPARIN SODIUM 40 MG/0.4ML IJ SOSY
40.0000 mg | PREFILLED_SYRINGE | INTRAMUSCULAR | Status: DC
  Filled 2021-02-05 (×2): qty 0.4

## 2021-02-05 MED ORDER — PRENATAL MULTIVITAMIN CH
1.0000 | ORAL_TABLET | Freq: Every day | ORAL | Status: DC
  Administered 2021-02-06 – 2021-02-08 (×3): 1 via ORAL
  Filled 2021-02-05 (×3): qty 1

## 2021-02-05 MED ORDER — POLYETHYLENE GLYCOL 3350 17 G PO PACK
17.0000 g | PACK | Freq: Every day | ORAL | Status: DC
  Administered 2021-02-06 – 2021-02-07 (×2): 17 g via ORAL
  Filled 2021-02-05 (×2): qty 1

## 2021-02-05 MED ORDER — SOD CITRATE-CITRIC ACID 500-334 MG/5ML PO SOLN: 30.0000 mL | ORAL | Status: DC | PRN

## 2021-02-05 MED ORDER — OXYCODONE-ACETAMINOPHEN 5-325 MG PO TABS: 2.0000 | ORAL_TABLET | ORAL | Status: DC | PRN

## 2021-02-05 MED ORDER — DIBUCAINE (PERIANAL) 1 % EX OINT: 1.0000 "application " | TOPICAL_OINTMENT | CUTANEOUS | Status: DC | PRN

## 2021-02-05 MED ORDER — ONDANSETRON HCL 4 MG/2ML IJ SOLN
INTRAMUSCULAR | Status: AC
  Filled 2021-02-05: qty 2

## 2021-02-05 MED ORDER — WITCH HAZEL-GLYCERIN EX PADS: 1.0000 "application " | MEDICATED_PAD | CUTANEOUS | Status: DC | PRN

## 2021-02-05 MED ORDER — NALBUPHINE HCL 10 MG/ML IJ SOLN: 5.0000 mg | INTRAMUSCULAR | Status: DC | PRN

## 2021-02-05 MED ORDER — ACETAMINOPHEN 160 MG/5ML PO SOLN: 1000.0000 mg | Freq: Once | ORAL | Status: DC

## 2021-02-05 MED ORDER — LACTATED RINGERS IV SOLN
500.0000 mL | INTRAVENOUS | Status: DC | PRN
  Administered 2021-02-05: 17:00:00 500 mL via INTRAVENOUS

## 2021-02-05 MED ORDER — BUPIVACAINE HCL (PF) 0.25 % IJ SOLN
INTRAMUSCULAR | Status: DC | PRN
  Administered 2021-02-05: 1 mL via INTRATHECAL

## 2021-02-05 MED ORDER — DIPHENHYDRAMINE HCL 50 MG/ML IJ SOLN: 12.5000 mg | INTRAMUSCULAR | Status: DC | PRN

## 2021-02-05 MED ORDER — PHENYLEPHRINE HCL (PRESSORS) 10 MG/ML IV SOLN
INTRAVENOUS | Status: DC | PRN
  Administered 2021-02-05: 80 ug via INTRAVENOUS

## 2021-02-05 MED ORDER — STERILE WATER FOR IRRIGATION IR SOLN
Status: DC | PRN
  Administered 2021-02-05: 1000 mL

## 2021-02-05 MED ORDER — SODIUM CHLORIDE 0.9 % IR SOLN
Status: DC | PRN
  Administered 2021-02-05: 1

## 2021-02-05 MED ORDER — PHENYLEPHRINE 40 MCG/ML (10ML) SYRINGE FOR IV PUSH (FOR BLOOD PRESSURE SUPPORT)
80.0000 ug | PREFILLED_SYRINGE | INTRAVENOUS | Status: DC | PRN
  Filled 2021-02-05: qty 10

## 2021-02-05 MED ORDER — COCONUT OIL OIL
1.0000 "application " | TOPICAL_OIL | Status: DC | PRN
  Administered 2021-02-08: 1 via TOPICAL

## 2021-02-05 SURGICAL SUPPLY — 34 items
BENZOIN TINCTURE PRP APPL 2/3 (GAUZE/BANDAGES/DRESSINGS) ×2 IMPLANT
CANISTER SUCT 3000ML PPV (MISCELLANEOUS) ×2 IMPLANT
CHLORAPREP W/TINT 26ML (MISCELLANEOUS) ×2 IMPLANT
CLOSURE STERI STRIP 1/2 X4 (GAUZE/BANDAGES/DRESSINGS) ×2 IMPLANT
DRSG OPSITE POSTOP 4X10 (GAUZE/BANDAGES/DRESSINGS) ×2 IMPLANT
ELECT REM PT RETURN 9FT ADLT (ELECTROSURGICAL) ×2
ELECTRODE REM PT RTRN 9FT ADLT (ELECTROSURGICAL) ×1 IMPLANT
EXTRACTOR VACUUM KIWI (MISCELLANEOUS) ×2 IMPLANT
GLOVE BIOGEL PI IND STRL 7.0 (GLOVE) ×2 IMPLANT
GLOVE BIOGEL PI IND STRL 7.5 (GLOVE) ×1 IMPLANT
GLOVE BIOGEL PI INDICATOR 7.0 (GLOVE) ×2
GLOVE BIOGEL PI INDICATOR 7.5 (GLOVE) ×1
GLOVE SKINSENSE NS SZ7.0 (GLOVE) ×1
GLOVE SKINSENSE STRL SZ7.0 (GLOVE) ×1 IMPLANT
GOWN STRL REUS W/ TWL LRG LVL3 (GOWN DISPOSABLE) ×2 IMPLANT
GOWN STRL REUS W/ TWL XL LVL3 (GOWN DISPOSABLE) ×1 IMPLANT
GOWN STRL REUS W/TWL LRG LVL3 (GOWN DISPOSABLE) ×2
GOWN STRL REUS W/TWL XL LVL3 (GOWN DISPOSABLE) ×1
NS IRRIG 1000ML POUR BTL (IV SOLUTION) ×2 IMPLANT
PACK C SECTION WH (CUSTOM PROCEDURE TRAY) ×2 IMPLANT
PAD ABD 7.5X8 STRL (GAUZE/BANDAGES/DRESSINGS) ×2 IMPLANT
PAD OB MATERNITY 4.3X12.25 (PERSONAL CARE ITEMS) ×2 IMPLANT
PAD PREP 24X48 CUFFED NSTRL (MISCELLANEOUS) ×2 IMPLANT
PENCIL SMOKE EVAC W/HOLSTER (ELECTROSURGICAL) ×2 IMPLANT
STRIP CLOSURE SKIN 1/2X4 (GAUZE/BANDAGES/DRESSINGS) ×2 IMPLANT
SUT MNCRL 0 VIOLET CTX 36 (SUTURE) ×2 IMPLANT
SUT MON AB 4-0 PS1 27 (SUTURE) ×2 IMPLANT
SUT MONOCRYL 0 CTX 36 (SUTURE) ×2
SUT PLAIN 2 0 XLH (SUTURE) ×2 IMPLANT
SUT VIC AB 0 CT1 36 (SUTURE) ×4 IMPLANT
SUT VIC AB 3-0 CT1 27 (SUTURE) ×1
SUT VIC AB 3-0 CT1 TAPERPNT 27 (SUTURE) ×1 IMPLANT
TOWEL OR 17X24 6PK STRL BLUE (TOWEL DISPOSABLE) ×4 IMPLANT
WATER STERILE IRR 1000ML POUR (IV SOLUTION) ×2 IMPLANT

## 2021-02-05 NOTE — Anesthesia Preprocedure Evaluation (Addendum)
Anesthesia Evaluation  Patient identified by MRN, date of birth, ID band Patient awake    Reviewed: Allergy & Precautions, NPO status , Patient's Chart, lab work & pertinent test results  History of Anesthesia Complications Negative for: history of anesthetic complications  Airway Mallampati: II  TM Distance: >3 FB Neck ROM: Full    Dental no notable dental hx.    Pulmonary neg pulmonary ROS,    Pulmonary exam normal        Cardiovascular negative cardio ROS Normal cardiovascular exam     Neuro/Psych negative neurological ROS  negative psych ROS   GI/Hepatic negative GI ROS, Neg liver ROS,   Endo/Other  negative endocrine ROS  Renal/GU negative Renal ROS  negative genitourinary   Musculoskeletal negative musculoskeletal ROS (+)   Abdominal   Peds  Hematology negative hematology ROS (+)   Anesthesia Other Findings Day of surgery medications reviewed with patient.  Reproductive/Obstetrics (+) Pregnancy (transverse lie)                            Anesthesia Physical Anesthesia Plan  ASA: 2 and emergent  Anesthesia Plan: Combined Spinal and Epidural   Post-op Pain Management:    Induction:   PONV Risk Score and Plan: 3 and Treatment may vary due to age or medical condition, Ondansetron and Dexamethasone  Airway Management Planned: Natural Airway  Additional Equipment: Fetal Monitoring  Intra-op Plan:   Post-operative Plan:   Informed Consent: I have reviewed the patients History and Physical, chart, labs and discussed the procedure including the risks, benefits and alternatives for the proposed anesthesia with the patient or authorized representative who has indicated his/her understanding and acceptance.       Plan Discussed with: CRNA  Anesthesia Plan Comments: (Patient in active labor with fetal transverse lie. Ate lunch at 1200 today. Plan for CSE for attempt at ECV.  If unsuccessful, will proceed to C/S. Stephannie Peters, MD)      Anesthesia Quick Evaluation

## 2021-02-05 NOTE — Anesthesia Postprocedure Evaluation (Signed)
Anesthesia Post Note  Patient: Gloria Kent  Procedure(s) Performed: CESAREAN SECTION     Patient location during evaluation: PACU Anesthesia Type: Combined Spinal/Epidural Level of consciousness: awake Pain management: pain level controlled Vital Signs Assessment: post-procedure vital signs reviewed and stable Respiratory status: spontaneous breathing Cardiovascular status: stable Postop Assessment: no headache, no backache, spinal receding, patient able to bend at knees and no apparent nausea or vomiting Anesthetic complications: no   No notable events documented.  Last Vitals:  Vitals:   02/05/21 2030 02/05/21 2130  BP: 125/85 119/72  Pulse: 66 98  Resp: 18 18  Temp: 36.7 C 36.7 C  SpO2: 99% 99%    Last Pain:  Vitals:   02/05/21 2242  TempSrc:   PainSc: 2    Pain Goal:                Epidural/Spinal Function Cutaneous sensation: Able to Wiggle Toes (02/05/21 2242), Patient able to flex knees: Yes (02/05/21 2242), Patient able to lift hips off bed: Yes (02/05/21 2242), Back pain beyond tenderness at insertion site: No (02/05/21 2242), Progressively worsening motor and/or sensory loss: No (02/05/21 2242), Bowel and/or bladder incontinence post epidural: No (02/05/21 2242)  Caren Macadam

## 2021-02-05 NOTE — Progress Notes (Addendum)
Patient arrived to MAU complaining of contractions that stated at 8am. Patient denies vaginal  bleeding and or leakage of fluid. + FM reported. Patient declined interpreter. She stated that she speaks english and understands. Effective communication maintained.

## 2021-02-05 NOTE — Progress Notes (Signed)
Epidural Removal Date/Time: 02/05/21 2000

## 2021-02-05 NOTE — MAU Note (Signed)
OB OR Charge nurse stated to bring patient to PACU in 10 minutes.

## 2021-02-05 NOTE — Anesthesia Procedure Notes (Signed)
Epidural Patient location during procedure: pre-op Start time: 02/05/2021 4:53 PM End time: 02/05/2021 4:56 PM  Staffing Anesthesiologist: Kaylyn Layer, MD Performed: anesthesiologist   Preanesthetic Checklist Completed: patient identified, IV checked, risks and benefits discussed, monitors and equipment checked, pre-op evaluation and timeout performed  Epidural Patient position: sitting Prep: DuraPrep and site prepped and draped Patient monitoring: continuous pulse ox, heart rate and blood pressure Approach: midline Location: L3-L4 Injection technique: LOR air  Needle:  Needle type: Tuohy  Needle gauge: 17 G Needle length: 9 cm Needle insertion depth: 5 cm Catheter type: closed end flexible Catheter size: 19 Gauge Catheter at skin depth: 10 cm  Assessment Sensory level: T4 Events: blood not aspirated, injection not painful, no injection resistance, no paresthesia and negative IV test  Additional Notes Patient identified. Risks, benefits, and alternatives discussed with patient including but not limited to bleeding, infection, nerve damage, paralysis, failed block, headache, blood pressure changes, nausea, vomiting, reactions to medication, itching, and postpartum back pain. Confirmed the patient's most recent platelet count. Confirmed with patient that they are not currently taking any anticoagulation, have any bleeding history, or any family history of bleeding disorders. Patient expressed understanding and wished to proceed. All questions were answered. Sterile technique was used throughout the entire procedure.    Crisp LOR with Tuohy needle on first pass. Whitacre 25g spinal needle introduced through Tuohy with clear CSF return prior to injection of intrathecal medication. Spinal needle withdrawn and epidural catheter threaded easily. Negative aspiration of catheter for heme or CSF.

## 2021-02-05 NOTE — Progress Notes (Signed)
Used interper Rosey Bath [phone] Mom states that she does not need an interper .

## 2021-02-05 NOTE — MAU Provider Note (Signed)
OB Note Patient 6cm, intact, transverse on u/s with head to maternal left. Fetus category I with accels and q6m UCs.  D/w her re: ecv vs primary c-section and r/b/a of both d/w her and that I recommend epidural before ecv. Pt would like to try ecv and if not successful then for c/s; pt declines btl.   Last po was a regular lunch at 1200  D/w ob anesthesia and or charge  Cornelia Copa MD Attending Center for [facility]) GYN Consult Phone: [phone] (M-F, 0800-1700) & [phone] (Off hours, weekends, holidays)

## 2021-02-05 NOTE — Transfer of Care (Signed)
Immediate Anesthesia Transfer of Care Note  Patient: Gloria Kent  Procedure(s) Performed: CESAREAN SECTION  Patient Location: PACU  Anesthesia Type:Epidural  Level of Consciousness: awake, alert  and patient cooperative  Airway & Oxygen Therapy: Patient Spontanous Breathing  Post-op Assessment: Report given to RN and Post -op Vital signs reviewed and stable  Post vital signs: Reviewed and stable  Last Vitals:  Vitals Value Taken Time  BP 105/61 02/05/21 1848  Temp    Pulse 92 02/05/21 1852  Resp 17 02/05/21 1852  SpO2 100 % 02/05/21 1852  Vitals shown include unvalidated device data.  Last Pain:  Vitals:   02/05/21 1734  TempSrc:   PainSc: 0-No pain         Complications: No notable events documented.

## 2021-02-05 NOTE — Lactation Note (Addendum)
This note was copied from a baby's chart. Lactation Consultation Note  Patient Name: Gloria Kent ILNZV'[id number] Date: 02/05/2021 Reason for consult: Initial assessment;Mother's request;Term;Breastfeeding assistance Age:28 hours Mom states her feeding plans are breast and bottle supplementing with formula.  Mom does not have a pump at home. She is interested in getting a hand pump, prefers to go over parts and usage in the morning.  Dad recently fed infant formula prior to [facility] arrival. LC not able to see a latch at this time.   Plan 1. To feed based on cues 8-12x 24hr period. Mom to offer breasts and look for signs of milk transfer.  2. If unable to latch, Mom can offer EBM via hand expression.  3. Mom to supplement with formula. BF supplementation guide provided. LC reviewed with parents pace bottle feeding with slow flow nipple. Mom aware if infant not latching, she can offer more.  4. I and O sheet reviewed.  All questions answered at the end of the visit.   Maternal Data Has patient been taught Hand Expression?: Yes Does the patient have breastfeeding experience prior to this delivery?: Yes How long did the patient breastfeed?: 1 year  Feeding Mother's Current Feeding Choice: Breast Milk and Formula  LATCH Score Latch: Grasps breast easily, tongue down, lips flanged, rhythmical sucking.  Audible Swallowing: Spontaneous and intermittent  Type of Nipple: Everted at rest and after stimulation  Comfort (Breast/Nipple): Soft / non-tender  Hold (Positioning): Assistance needed to correctly position infant at breast and maintain latch.  LATCH Score: 9   Lactation Tools Discussed/Used    Interventions Interventions: Breast feeding basics reviewed;Position options;Education;Pace feeding;Expressed milk;Hand express;LC Services brochure;Infant Driven Feeding Algorithm education;Breast massage  Discharge WIC Program: Yes  Consult Status Consult Status: Follow-up Date:  02/06/21 Follow-up type: In-patient    Chantay Whitelock  Nicholson-Springer 02/05/2021, 9:59 PM

## 2021-02-05 NOTE — Procedures (Signed)
External Cephalic Version Procedure Note  Pre-procedure Diagnosis: 39/5 Weeks. Active labor. Transverse presentation  Post-procedure Diagnosis: same  Procedure Details  The risks (including ROM, abruption, non reassuring fetal heart tones, need for stat c-section) and benefits of the procedure were explained to the patient and Written informed consent was obtained.  The patient was placed in the dorsal supine position after placement of CSE. Bedside u/s showed transverse back dorsal, fetal head to maternal right. Normal FHR and subjectively normal AF.  5-6 attempts were made to bring the head to cephalic presentation and I was able to after the 2nd attempt but baby kept going to oblique with head at 7 o'clock. Fetus tolerated it well, except after the 2nd attempt where it was in the 80s for about 45-60 seconds. I did a vaginal exam and patient was 6-7/50 with BOW intact and fetal hand presenting. I tried to do two ECV attempts with a hand vaginally and another externally but I was unable to move the hand out of the way and keep the head down.  Condition: Stable  Complications: None  Plan: Recommend c-section which she is amenable to. In the MAU, pt declined a BTL if she needed a c-section.  Cornelia Copa MD Attending Center for [employer] [facility])

## 2021-02-05 NOTE — Progress Notes (Signed)
1615 Dr. Vergie Living in attendance at bedside to discuss plans of delivery with patient. Questions was answered. Understanding was verbalized, patient agreed.

## 2021-02-05 NOTE — Brief Op Note (Signed)
02/05/2021  6:51 PM  PATIENT:  [name]  30 y.o. female  PRE-OPERATIVE DIAGNOSIS:  failed version, malpresentation  POST-OPERATIVE DIAGNOSIS:  failed version, malpresentation  PROCEDURE:  pLTCS  SURGEON:  Surgeon(s) and Role:    * [place] Bing, MD - Primary  PHYSICIAN ASSISTANT:   ASSISTANTS: none   ANESTHESIA:   epidural  EBL:  525 mL   BLOOD ADMINISTERED:none  DRAINS:  indwelling foley    LOCAL MEDICATIONS USED:  NONE  SPECIMEN:  none  DISPOSITION OF SPECIMEN:  N/A  COUNTS:  YES  TOURNIQUET:  * No tourniquets in log *  DICTATION: .Note written in EPIC  PLAN OF CARE: Admit to inpatient   PATIENT DISPOSITION:  PACU - hemodynamically stable.   Delay start of Pharmacological VTE agent (>24hrs) due to surgical blood loss or risk of bleeding: yes  Cornelia Copa MD Attending Center for [employer] (Faculty Practice) 02/05/2021 Time: [phone]

## 2021-02-05 NOTE — MAU Provider Note (Signed)
S: Ms. Maxine Fredman is a 30 y.o. G2P1001 at [redacted]w[redacted]d  who presents to MAU today complaining contractions q 4-5 minutes since noon. She denies vaginal bleeding. She denies LOF. She reports normal fetal movement.    O: LMP 04/10/2020  GENERAL: Well-developed, well-nourished female in no acute distress.  HEAD: Normocephalic, atraumatic.  CHEST: Normal effort of breathing, regular heart rate ABDOMEN: Soft, nontender, gravid  Cervical exam:  Dilation: 5 Effacement (%): 90 Cervical Position: Middle Presentation: Transverse Exam by:: [place]ells.CNM   Fetal Monitoring: Baseline: 120 Variability: moderate Accelerations: 15x15 Decelerations: none Contractions: 2-5  Pt informed that the ultrasound is considered a limited OB ultrasound and is not intended to be a complete ultrasound exam.  Patient also informed that the ultrasound is not being completed with the intent of assessing for fetal or placental anomalies or any pelvic abnormalities.  Explained that the purpose of today's ultrasound is to assess for  presentation.  Patient acknowledges the purpose of the exam and the limitations of the study.     Transverse presentation seen on ultrasound. Dr. Vergie Living notified and will come see patient in MAU.   A: SIUP at [redacted]w[redacted]d  Active labor  P: Admit to inpatient for delivery   Rolm Bookbinder, [place] 02/05/2021 4:07 PM

## 2021-02-05 NOTE — Op Note (Signed)
Operative Note   SURGERY DATE: 02/05/2021  PRE-OP DIAGNOSIS:  *Pregnancy at 39/5 *Failed external cephalic version *Active labor  POST-OP DIAGNOSIS: Same. Delivered. Meconium. Homero Fellers breech   PROCEDURE: primary low transverse cesarean section via pfannenstiel skin incision with double layer uterine closure  SURGEON: Surgeon(s) and Role:    [place] Bing, MD - Primary  ASSISTANT: None  ANESTHESIA: epidural  ESTIMATED BLOOD LOSS: 300 mL  DRAINS: UOP via indwelling foley  TOTAL IV FLUIDS: per OR report  VTE PROPHYLAXIS: SCDs to bilateral lower extremities  ANTIBIOTICS: Two grams of Cefazolin were given., within 1 hour of skin incision and azithromycin 500mg  IV given intra operatively  SPECIMENS: none  COMPLICATIONS: none  FINDINGS: No intra-abdominal adhesions were noted. Grossly normal uterus, tubes and ovaries. Moderate meconium stained amniotic fluid, footling breech female infant, weight 3630gm, APGARs 8/9, intact placenta.  PROCEDURE IN DETAIL: The patient was taken to the operating room where anesthesia was administered and normal fetal heart tones were confirmed. She was then prepped and draped in the normal fashion in the dorsal supine position with a leftward tilt.  After a time out was performed, a pfannensteil skin incision was made with the scalpel and carried through to the underlying layer of fascia. The fascia was then incised at the midline and this incision was extended laterally with the mayo scissors. Attention was turned to the superior aspect of the fascial incision which was grasped with the kocher clamps x 2, tented up and the rectus muscles were dissected off with the bovie. In a similar fashion the inferior aspect of the fascial incision was grasped with the kocher clamps, tented up and the rectus muscles dissected off with the mayo scissors. The rectus muscles were then separated in the midline and the peritoneum was entered bluntly. The bladder  blade was inserted and the vesicouterine peritoneum was identified, tented up and entered with the metzenbaum scissors. This incision was extended laterally and the bladder flap was created digitally. The bladder blade was reinserted.  A low transverse hysterotomy was made with the scalpel until the endometrial cavity was breached and the amniotic sac ruptured with the Allis clamp, yielding moderate mecoium stained amniotic fluid. This incision was extended bluntly and the infant's head, shoulders and body were delivered atraumatically.The cord was clamped x 2 and cut, and the infant was handed to the awaiting pediatricians, after delayed cord clamping was not done.  The placenta was then gradually expressed from the uterus and then the uterus was exteriorized and cleared of all clots and debris. The hysterotomy was repaired with a running suture of 1-0 monocryl. A second imbricating layer of 1-0 monocryl suture was then placed to achieve excellent hemostasis.   The uterus and adnexa were then returned to the abdomen, and the hysterotomy and all operative sites were reinspected and excellent hemostasis was noted after irrigation and suction of the abdomen with warm saline.  The peritoneum was closed with a running stitch of 3-0 Vicryl. The fascia was reapproximated with 0 Vicryl in a simple running fashion bilaterally. The subcutaneous layer was then reapproximated with interrupted sutures of 2-0 plain gut, and the skin was then closed with 4-0 monocryl, in a subcuticular fashion.  The patient  tolerated the procedure well. Sponge, lap, needle, and instrument counts were correct x 2. The patient was transferred to the recovery room awake, alert and breathing independently in stable condition.  10/9 MD Attending Center for [facility])

## 2021-02-06 ENCOUNTER — Encounter (HOSPITAL_COMMUNITY): Payer: Medicaid Other

## 2021-02-06 ENCOUNTER — Encounter (HOSPITAL_COMMUNITY): Payer: Self-pay | Admitting: Obstetrics and Gynecology

## 2021-02-06 LAB — CBC
HCT: 30.4 % — ABNORMAL LOW (ref 36.0–46.0)
Hemoglobin: 9.8 g/dL — ABNORMAL LOW (ref 12.0–15.0)
MCH: 24.6 pg — ABNORMAL LOW (ref 26.0–34.0)
MCHC: 32.2 g/dL (ref 30.0–36.0)
MCV: 76.4 fL — ABNORMAL LOW (ref 80.0–100.0)
Platelets: 229 10*3/uL (ref 150–400)
RBC: 3.98 MIL/uL (ref 3.87–5.11)
RDW: 15.1 % (ref 11.5–15.5)
WBC: 12.6 10*3/uL — ABNORMAL HIGH (ref 4.0–10.5)
nRBC: 0 % (ref 0.0–0.2)

## 2021-02-06 LAB — RPR: RPR Ser Ql: NONREACTIVE

## 2021-02-06 MED ORDER — LACTATED RINGERS IV SOLN: INTRAVENOUS | Status: DC

## 2021-02-06 MED ORDER — FERROUS SULFATE 325 (65 FE) MG PO TABS
325.0000 mg | ORAL_TABLET | Freq: Every day | ORAL | Status: DC
  Administered 2021-02-06 – 2021-02-08 (×3): 325 mg via ORAL
  Filled 2021-02-06 (×3): qty 1

## 2021-02-06 NOTE — Progress Notes (Signed)
Subjective: Postpartum Day #1: Cesarean Delivery after failed ECV; patient arrived at 6 cm and head was transverse.  Patient reports tolerating PO.  No flatus or BM, foley catheter still in place.   Objective: Vital signs in last 24 hours: Temp:  [97.5 F (36.4 C)-98.5 F (36.9 C)] 98.1 F (36.7 C) (11/28 0336) Pulse Rate:  [66-113] 88 (11/27 2350) Resp:  [16-21] 18 (11/28 0336) BP: (105-142)/(65-94) 116/70 (11/27 2350) SpO2:  [93 %-100 %] 99 % (11/28 0336) Weight:  [74.4 kg] 74.4 kg (11/27 1628)  Physical Exam:  General: alert, cooperative, and no distress Lochia: appropriate Uterine Fundus: firm Incision: unable to assess DVT Evaluation: No evidence of DVT seen on physical exam.  Recent Labs    02/05/21 1604 02/06/21 0436  HGB 12.4 9.8*  HCT 39.8 30.4*    Assessment/Plan: Status post Cesarean section. Doing well postoperatively.  Continue current care. -D/C foley, encourage ambulation -start iron orally -continue routine post-op care Charlesetta Garibaldi [facility] 02/06/2021, 7:26 AM

## 2021-02-06 NOTE — Plan of Care (Signed)
  Problem: Education: Goal: Knowledge of General Education information will improve Description: Including pain rating scale, medication(s)/side effects and non-pharmacologic comfort measures Note: Patient declines the need for interpreter. Encouraged patient to request interpreter as needed. Gloria Kent Gloria Kent    Problem: Activity: Goal: Risk for activity intolerance will decrease Note: Ambulated patient in room and removed foley catheter at around 1050. Patient did well and is able to ambulate independently now. Gloria Kent, Gloria Kent

## 2021-02-06 NOTE — Discharge Summary (Signed)
Postpartum Discharge Summary  Date of Service updated11/30/22     Patient Name: Gloria Kent DOB: 03/18/1990 MRN: [id number]  Date of admission: 02/05/2021 Delivery date:02/05/2021  Delivering provider: Aletha Halim  Date of discharge: 02/08/2021  Admitting diagnosis: Normal labor [O80, Z37.9] Status post surgery [Z98.890] Intrauterine pregnancy: [redacted]w[redacted]d    Secondary diagnosis:  Principal Problem:   Normal labor Active Problems:   Status post surgery  Additional problems: asymptomatic covid+   Discharge diagnosis: Term Pregnancy Delivered                                              Post partum procedures: none Augmentation: N/A Complications: None  Hospital course: Onset of Labor With Unplanned C/S   30y.o. yo G2P2002 at 383w5das [place] 02/05[place]20[place]. Patient had a labor course significant for none-PCS for breech. The patient went for cesarean section due to Malpresentation. Delivery details as follows: Membrane Rupture Time/Date: 6:01 PM ,02/05/2021   Delivery Method:C-Section, Low Transverse  Details of operation can be found in separate operative note. Patient had an uncomplicated postpartum course.  She is ambulating,tolerating a regular diet, passing flatus, and urinating well.  Patient is discharged home in stable condition 02/08/21.  Newborn Data: Birth date:02/05/2021  Birth time:6:02 PM  Gender:Female  Living status:Living  Apgars:8 ,9  Weight:3630 g   Magnesium Sulfate received: No BMZ received: No Rhophylac:No MMR:No T-DaP:Given prenatally Flu: No Transfusion:No  Physical exam  Vitals:   02/07/21 0358 02/07/21 1549 02/07/21 2300 02/08/21 0500  BP: (!) 105/53 97/61 109/69 103/70  Pulse: 75 81 80 65  Resp: 16 16 16 16   Temp: 98.2 F (36.8 C) 97.7 F (36.5 C) 98 F (36.7 C) 97.6 F (36.4 C)  TempSrc: Oral Oral    SpO2: 100% 100% 100% 100%  Weight:      Height:       General: alert, cooperative, and no  distress Lochia: appropriate Uterine Fundus: firm Incision: Healing well with no significant drainage, No significant erythema, Dressing is clean, dry, and intact DVT Evaluation: No evidence of DVT seen on physical exam. Negative Homan's sign. No cords or calf tenderness. No significant calf/ankle edema. Labs: Lab Results  Component Value Date   WBC 12.6 (H) 02/06/2021   HGB 9.8 (L) 02/06/2021   HCT 30.4 (L) 02/06/2021   MCV 76.4 (L) 02/06/2021   PLT 229 02/06/2021   CMP Latest Ref Rng & Units 02/05/2021  Glucose 70 - 99 mg/dL -  BUN 6 - 20 mg/dL -  Creatinine 0.44 - 1.00 mg/dL 0.95  Sodium 135 - 145 mmol/L -  Potassium 3.5 - 5.1 mmol/L -  Chloride 98 - 111 mmol/L -  CO2 22 - 32 mmol/L -  Calcium 8.9 - 10.3 mg/dL -  Total Protein 6.5 - 8.1 g/dL -  Total Bilirubin 0.3 - 1.2 mg/dL -  Alkaline Phos 38 - 126 U/L -  AST 15 - 41 U/L -  ALT 0 - 44 U/L -   Edinburgh Score: Edinburgh Postnatal Depression Scale Screening Tool 02/05/2021  I have been able to laugh and see the funny side of things. 0  I have looked forward with enjoyment to things. 0  I have blamed myself unnecessarily when things went wrong. 0  I have been anxious or worried for no good reason. 0  I have felt scared or panicky for no good reason. 0  Things have been getting on top of me. 1  I have been so unhappy that I have had difficulty sleeping. 0  I have felt sad or miserable. 0  I have been so unhappy that I have been crying. 0  The thought of harming myself has occurred to me. 0  Edinburgh Postnatal Depression Scale Total 1     After visit meds:  Allergies as of 02/08/2021   No Known Allergies      Medication List     STOP taking these medications    acetaminophen 325 MG tablet Commonly known as: Tylenol       TAKE these medications    ferrous sulfate 325 (65 FE) MG EC tablet Take 1 tablet (325 mg total) by mouth every other day.   ibuprofen 600 MG tablet Commonly known as:  ADVIL Take 1 tablet (600 mg total) by mouth every 6 (six) hours as needed for mild pain, moderate pain or cramping.   Prenatal 19 29-1 MG Tabs Take 1 tablet by mouth daily.         Discharge home in stable condition Infant Feeding: Bottle and Breast Infant Disposition:home with mother Discharge instruction: per After Visit Summary and Postpartum booklet. Activity: Advance as tolerated. Pelvic rest for 6 weeks.  Diet: routine diet Future Appointments:No future appointments. Follow up Visit:  Follow-up Information     Department, [facility] Follow up.   Why: 4-6 weeks for your postpartum visit Contact information: [address] [place] [address] [phone]                F/U: 1-2wks for incision check Plans depo, doesn't want in hospital Declines circumcision  02/08/2021 Roma Schanz, CNM

## 2021-02-06 NOTE — Progress Notes (Signed)
Called on call to get an order for LR .Order was given .

## 2021-02-06 NOTE — Lactation Note (Addendum)
This note was copied from a baby's chart. Lactation Consultation Note  Patient Name: Gloria Kent ZWCHE'N Date: 02/06/2021 Reason for consult: Follow-up assessment;Term Age:28 hours  P2 mother whose infant is now 75 hours old.  This is a term baby at 39+5 weeks.  Mother's current feeding preference is breast/formula.  When I arrived mother was formula feeding with baby laying completely flat in the bassinet.  Education completed on the safe way to feed baby. Demonstrated correct positioning to hold baby upright during feedings.  Offered to place baby in her arms for feeding, however, mother declined stating she was in pain.  Asked her if she required pain medication and she stated the RN just gave her some.  Verified with the RN and pain medication was given approximately 3 hours ago.  Mother had formula fed 17 mls when I arrived; suggested she burp after every 10 mls.  Demonstrated effective burping and baby burped well.  This was an adequate amount for his age and he was content.  Mother has not been consistently putting baby to the breast.  She is primarily formula feeding.  Discussed the importance of latching with every feeding prior to giving formula supplementation to help ensure a good milk supply.  Suggested she continue to hand express and feed back EBM to baby.  Mother demonstrated hand expression and colostrum easily expressed.  Offered to return as needed for latch assistance.  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  She does not have a DEBP for home use.  She has a manual pump.  No support person present at this time.  RN updated.   Maternal Data    Feeding Mother's Current Feeding Choice: Breast Milk and Formula Nipple Type: Slow - flow  LATCH Score                    Lactation Tools Discussed/Used    Interventions Interventions: Breast feeding basics reviewed;Education  Discharge     Consult Status Consult Status: Follow-up Date: 02/07/21 Follow-up type: In-patient    Dora Sims 02/06/2021, 12:18 PM

## 2021-02-07 MED ORDER — ACETAMINOPHEN 500 MG PO TABS
ORAL_TABLET | ORAL | Status: AC
  Filled 2021-02-07: qty 2

## 2021-02-07 MED ORDER — ACETAMINOPHEN 500 MG PO TABS
1000.0000 mg | ORAL_TABLET | Freq: Four times a day (QID) | ORAL | Status: DC
  Administered 2021-02-07 – 2021-02-08 (×3): 1000 mg via ORAL
  Filled 2021-02-07 (×3): qty 2

## 2021-02-07 NOTE — Lactation Note (Signed)
This note was copied from a baby's chart. Lactation Consultation Note  Patient Name: Gloria Kent YJEHU'D Date: 02/07/2021 Reason for consult: Follow-up assessment;Term Age:28 hours   LC Note:  Mother's current feeding preference is breast/formula, however, she has been primarily formula feeding.  Per RN, mother does not request any further [facility] consults.  She will call as needed.  Status changed to PRN.   Maternal Data    Feeding Mother's Current Feeding Choice: Breast Milk and Formula Nipple Type: Slow - flow  LATCH Score                    Lactation Tools Discussed/Used    Interventions    Discharge    Consult Status Consult Status: PRN Date: 02/07/21 Follow-up type: In-patient    Margurite Duffy R Faithlynn Deeley 02/07/2021, 1:17 PM

## 2021-02-07 NOTE — Plan of Care (Signed)
  Problem: Activity: Goal: Risk for activity intolerance will decrease Outcome: Completed/Met Note: Discussed the importance of increasing activity throughout the day to help with pain. Patient states she feels more pain today; however, she does not want any narcotics at this time. Encouraged patient to call for additional pain medication as needed. Called MD to restart the scheduled tylenol along with the ibuprofen. Maxwell Caul, Leretha Dykes Campbell

## 2021-02-07 NOTE — Progress Notes (Signed)
Subjective: Postpartum Day 2: Cesarean Delivery Patient reports incisional pain, tolerating PO, and no problems voiding.    Objective: Vital signs in last 24 hours: Temp:  [97.7 F (36.5 C)-98.2 F (36.8 C)] 97.7 F (36.5 C) (11/29 1549) Pulse Rate:  [75-91] 81 (11/29 1549) Resp:  [16-18] 16 (11/29 1549) BP: (97-105)/(53-68) 97/61 (11/29 1549) SpO2:  [99 %-100 %] 100 % (11/29 1549)  Physical Exam:  General: alert, cooperative, and no distress Lochia: appropriate Uterine Fundus: firm Incision: no significant drainage, no dehiscence, no significant erythema DVT Evaluation: No evidence of DVT seen on physical exam. Negative Homan's sign.  Recent Labs    02/05/21 1604 02/06/21 0436  HGB 12.4 9.8*  HCT 39.8 30.4*    Assessment/Plan: Status post Cesarean section. Doing well postoperatively.  Continue current care. No symptoms of COVID. Discharge tomorrow  Levie Heritage 02/07/2021, 4:32 PM

## 2021-02-08 MED ORDER — OXYCODONE HCL 5 MG PO TABS: 5.0000 mg | ORAL_TABLET | Freq: Four times a day (QID) | ORAL | 0 refills | Status: DC | PRN

## 2021-02-08 MED ORDER — IBUPROFEN 600 MG PO TABS: 600.0000 mg | ORAL_TABLET | Freq: Four times a day (QID) | ORAL | 0 refills | Status: DC | PRN

## 2021-02-08 NOTE — Discharge Instructions (Addendum)
NO SEX UNTIL AFTER YOU GET YOUR BIRTH CONTROL Remove Dressing 5 days after surgery on Friday, December 2

## 2021-02-08 NOTE — Progress Notes (Signed)
Discharge paperwork complete and patient waiting for ride.Two and a half hours after paperwork complete, patient called out stated she was short of breath. RN assessed. Vital signs stable; see flowsheets. Lungs clear bilaterally. Called Alverda Skeans who stated she would come to assess patient. Natalia Leatherwood assessed patient with orders to continue with discharge. Earl Gala, Linda Hedges [place]

## 2021-02-09 NOTE — H&P (Signed)
Obstetrics Admission History & Physical  02/05/2021  Primary OBGYN: [facility]  Chief Complaint: active labor, breech  History of Present Illness  30 y.o. [id number] @ [redacted]w[redacted]d (Dating: ), with the above CC. Pregnancy complicated by: nothing.  Ms. Morghan Kester states that she has regular contractions, no LOF. 6cm and transverse on bedside US.   Review of Systems: as noted in the History of Present Illness.  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   Microcytic anemia 10/02/2011   Sinus tachycardia 10/02/2011   Exudative pleural effusion 10/02/2011   Protein malnutrition (HCC) 10/02/2011    PMHx:  Past Medical History:  Diagnosis Date   Dysrhythmia 10/02/11   "lately heart is beating faster"   Headache(784.0) 10/02/11   "weekly"   Pleural effusion 10/02/11   TB (pulmonary tuberculosis) questionable   hospitalized in 2014 treated until negative   PSHx:  Past Surgical History:  Procedure Laterality Date   CESAREAN SECTION N/A 02/05/2021   Procedure: CESAREAN SECTION;  Surgeon: Byers Bing, MD;  Location: [facility];  Service: Obstetrics;  Laterality: N/A;   EXTERNAL CEPHALIC VERSION  02/05/2021   PLEURAL EFFUSION DRAINAGE  10/04/2011   Procedure: DRAINAGE OF PLEURAL EFFUSION;  Surgeon: Alleen Borne, MD;  Location: [facility] OR;  Service: Thoracic;  Laterality: Left;   Medications:  No medications prior to admission.     Allergies: has No Known Allergies. OBHx:  OB History  Gravida Para Term Preterm AB Living  2 2 2     2   SAB IAB Ectopic Multiple Live Births        0 2    # Outcome Date GA Lbr Len/2nd Weight Sex Delivery Anes PTL Lv  2 Term 02/05/21 103w5d  3630 g M CS-LTranv Spinal, EPI  LIV  1 Term 11/04/17 [redacted]w[redacted]d 47:49 / 07:03 3785 g F  Vag-Vacuum EPI  LIV         FHx:  Family History  Problem Relation Age of Onset   Hypertension Maternal Grandmother    Soc Hx:  Social History   Socioeconomic History   Marital status: Married    Spouse name: [redacted]w[redacted]d   Number of children: Not on file   Years of education: Not on file   Highest education level: Not on file  Occupational History   Not on file  Tobacco Use   Smoking status: Never   Smokeless tobacco: Never  Vaping Use   Vaping Use: Never used  Substance and Sexual Activity   Alcohol use: No   Drug use: No   Sexual activity: Yes  Other Topics Concern   Not on file  Social History Narrative   Recent immigrant from [place] 1 year ago.  Now lives with husband in [place]. Feels she has help with baby.   Social Determinants of Health   Financial Resource Strain: Not on file  Food Insecurity: Not on file  Transportation Needs: Not on file  Physical Activity: Not on file  Stress: Not on file  Social Connections: Not on file  Intimate Partner Violence: Not on file    Objective  AFVS normal and stable EFM: category I with accels. Toco q2-16m  General: Well nourished, well developed female in no acute distress.  Skin:  Warm and dry.  Cardiovascular: S1, S2 normal, no murmur, rub or gallop, regular rate and rhythm Respiratory:  Clear to auscultation bilateral. Normal respiratory effort Abdomen: gravid, nttp Neuro/Psych:  Normal mood and affect.   Labs  Pending   Radiology: As per HPI   Assessment & Plan   30 y.o. [id number] @ [redacted]w[redacted]d with labor, transverse; pt stable Plan for epidural, ecv and c/s if not successful  Cornelia Copa. MD Attending [facility])

## 2021-02-13 ENCOUNTER — Inpatient Hospital Stay (HOSPITAL_COMMUNITY)
Admission: AD | Admit: 2021-02-13 | Discharge: 2021-02-13 | Disposition: A | Discharge status: Home / Self Care | Payer: Medicaid Other | Attending: Family Medicine | Admitting: Family Medicine

## 2021-02-13 ENCOUNTER — Other Ambulatory Visit: Payer: Self-pay

## 2021-02-13 DIAGNOSIS — O9089 Other complications of the puerperium, not elsewhere classified: Secondary | ICD-10-CM | POA: Diagnosis present

## 2021-02-13 DIAGNOSIS — Z98891 History of uterine scar from previous surgery: Secondary | ICD-10-CM

## 2021-02-13 DIAGNOSIS — R609 Edema, unspecified: Secondary | ICD-10-CM | POA: Diagnosis not present

## 2021-02-13 NOTE — MAU Provider Note (Signed)
History     CSN: [id number]  Arrival date and time: 02/13/21 1607   Event Date/Time   First Provider Initiated Contact with Patient 02/13/21 1648      Chief Complaint  Patient presents with   Postpartum Complications   HPI This is a 30yo G2P2002 who is about 1 week post op from Cesarean delivery due to breech position. She called to make a wound check appt and was asked if she had swelling or spots in her vision. She was directed here for evaluation because she admitted to both. Mild edema in her ankles. Occasional spots in her vision, usually first thing in the morning. Breastfeeding without difficulty. No headache or other vision changes.  OB History     Gravida  2   Para  2   Term  2   Preterm      AB      Living  2      SAB      IAB      Ectopic      Multiple  0   Live Births  2           Past Medical History:  Diagnosis Date   Dysrhythmia 10/02/11   "lately heart is beating faster"   Headache(784.0) 10/02/11   "weekly"   Pleural effusion 10/02/11   TB (pulmonary tuberculosis) questionable   hospitalized in 2014 treated until negative    Past Surgical History:  Procedure Laterality Date   CESAREAN SECTION N/A 02/05/2021   Procedure: CESAREAN SECTION;  Surgeon: [place] Bing, MD;  Location: [facility];  Service: Obstetrics;  Laterality: N/A;   EXTERNAL CEPHALIC VERSION  02/05/2021   PLEURAL EFFUSION DRAINAGE  10/04/2011   Procedure: DRAINAGE OF PLEURAL EFFUSION;  Surgeon: Alleen Borne, MD;  Location: [facility] OR;  Service: Thoracic;  Laterality: Left;    Family History  Problem Relation Age of Onset   Hypertension Maternal Grandmother     Social History   Tobacco Use   Smoking status: Never   Smokeless tobacco: Never  Vaping Use   Vaping Use: Never used  Substance Use Topics   Alcohol use: No   Drug use: No    Allergies: No Known Allergies  Medications Prior to Admission  Medication Sig Dispense Refill Last Dose   ferrous sulfate  325 (65 FE) MG EC tablet Take 1 tablet (325 mg total) by mouth every other day. 45 tablet 3    ibuprofen (ADVIL) 600 MG tablet Take 1 tablet (600 mg total) by mouth every 6 (six) hours as needed for mild pain, moderate pain or cramping. 30 tablet 0    oxyCODONE (ROXICODONE) 5 MG immediate release tablet Take 1 tablet (5 mg total) by mouth every 6 (six) hours as needed for breakthrough pain. 10 tablet 0    Prenatal Vit-DSS-Fe Fum-FA (PRENATAL 19) 29-1 MG TABS Take 1 tablet by mouth daily. 30 tablet 11     Review of Systems Physical Exam   Blood pressure 108/69, pulse 81, temperature 98.3 F (36.8 C), resp. rate 18, SpO2 100 %, currently breastfeeding.  Physical Exam Vitals reviewed.  Constitutional:      Appearance: Normal appearance.  Cardiovascular:     Rate and Rhythm: Normal rate and regular rhythm.     Pulses: Normal pulses.  Pulmonary:     Effort: Pulmonary effort is normal.     Breath sounds: Normal breath sounds.  Skin:    General: Skin is warm and dry.  Capillary Refill: Capillary refill takes less than 2 seconds.  Neurological:     General: No focal deficit present.     Mental Status: She is alert.  Psychiatric:        Mood and Affect: Mood normal.        Behavior: Behavior normal.        Thought Content: Thought content normal.        Judgment: Judgment normal.    MAU Course  Procedures  MDM   Assessment and Plan   1. Status post cesarean delivery   2. Edema, unspecified type    No concern for postpartum preeclampsia.  BP normal. Discharge to home with symptomatic treatment.  Levie Heritage 02/13/2021, 4:50 PM

## 2021-02-13 NOTE — MAU Note (Addendum)
Had emergency c/s on 11/27. Called to make appt 1-2wks to check healing. Nurse was asking about swelling- feet have been a little swollen. was seeing some flashes in her rt eye this morning, but that is gone now. Has been short of breath at times, when talking and caring for her 30 yr old, denies at this time. Was told to come in to get checked out. Medication takes care of incisional pain, every day gets better. Would like someone to look at incision, was told we would while she is here

## 2021-02-17 ENCOUNTER — Encounter: Payer: Self-pay | Admitting: *Deleted

## 2021-02-18 ENCOUNTER — Telehealth (HOSPITAL_COMMUNITY): Payer: Self-pay

## 2021-02-18 NOTE — Telephone Encounter (Signed)
No answer. No voicemail set up.  Marcelino Duster [facility] 02/18/2021,0926

## 2021-03-28 ENCOUNTER — Inpatient Hospital Stay (HOSPITAL_COMMUNITY)
Admission: AD | Admit: 2021-03-28 | Discharge: 2021-03-28 | Disposition: A | Discharge status: Home / Self Care | Payer: Medicaid Other | Attending: Obstetrics & Gynecology | Admitting: Obstetrics & Gynecology

## 2021-03-28 ENCOUNTER — Other Ambulatory Visit: Payer: Self-pay

## 2021-03-28 DIAGNOSIS — O9089 Other complications of the puerperium, not elsewhere classified: Secondary | ICD-10-CM | POA: Diagnosis present

## 2021-03-28 DIAGNOSIS — Z98891 History of uterine scar from previous surgery: Secondary | ICD-10-CM | POA: Insufficient documentation

## 2021-03-28 NOTE — MAU Note (Signed)
Intact, no drainage noted. Approx 1 in from end on rt side, 1 mm?pustule noted. When moved clothing to look again- is now red and wet.

## 2021-03-28 NOTE — MAU Provider Note (Signed)
History     CSN: [id number]  Arrival date and time: 03/28/21 1536   Event Date/Time   First Provider Initiated Contact with Patient 03/28/21 1600      Chief Complaint  Patient presents with   Post-op Problem   31 y.o. G2P2 s/p CS 7 wks ago presenting with concerns about her incision. Reports feeling pain and saw some blood from the right side of her incision 2 days ago. States she was lifting something heavy prior. Denies pain or bleeding today. No fevers.    OB History     Gravida  2   Para  2   Term  2   Preterm      AB      Living  2      SAB      IAB      Ectopic      Multiple  0   Live Births  2           Past Medical History:  Diagnosis Date   Dysrhythmia 10/02/11   "lately heart is beating faster"   Headache(784.0) 10/02/11   "weekly"   Pleural effusion 10/02/11   TB (pulmonary tuberculosis) questionable   hospitalized in 2014 treated until negative    Past Surgical History:  Procedure Laterality Date   CESAREAN SECTION N/A 02/05/2021   Procedure: CESAREAN SECTION;  Surgeon: Aletha Halim, MD;  Location: [facility];  Service: Obstetrics;  Laterality: N/A;   EXTERNAL CEPHALIC VERSION  [id number]   PLEURAL EFFUSION DRAINAGE  10/04/2011   Procedure: DRAINAGE OF PLEURAL EFFUSION;  Surgeon: Gaye Pollack, MD;  Location: [facility];  Service: Thoracic;  Laterality: Left;    Family History  Problem Relation Age of Onset   Hypertension Maternal Grandmother     Social History   Tobacco Use   Smoking status: Never   Smokeless tobacco: Never  Vaping Use   Vaping Use: Never used  Substance Use Topics   Alcohol use: No   Drug use: No    Allergies: No Known Allergies  Medications Prior to Admission  Medication Sig Dispense Refill Last Dose   ferrous sulfate 325 (65 FE) MG EC tablet Take 1 tablet (325 mg total) by mouth every other day. 45 tablet 3    ibuprofen (ADVIL) 600 MG tablet Take 1 tablet (600 mg total) by mouth every 6 (six) hours  as needed for mild pain, moderate pain or cramping. 30 tablet 0    oxyCODONE (ROXICODONE) 5 MG immediate release tablet Take 1 tablet (5 mg total) by mouth every 6 (six) hours as needed for breakthrough pain. 10 tablet 0    Prenatal Vit-DSS-Fe Fum-FA (PRENATAL 19) 29-1 MG TABS Take 1 tablet by mouth daily. 30 tablet 11     Review of Systems  Constitutional:  Negative for fever.  Gastrointestinal:  Negative for abdominal pain.  Physical Exam   Blood pressure 106/76, pulse 88, temperature 98.1 F (36.7 C), temperature source Oral, resp. rate 17, weight 69.9 kg, SpO2 100 %, currently breastfeeding.  Physical Exam Vitals and nursing note reviewed. Exam conducted with a chaperone present.  Constitutional:      Appearance: Normal appearance.  HENT:     Head: Normocephalic and atraumatic.  Cardiovascular:     Rate and Rhythm: Normal rate.  Pulmonary:     Effort: Pulmonary effort is normal. No respiratory distress.  Abdominal:     Palpations: Abdomen is soft.     Tenderness: There is no abdominal  tenderness.  Musculoskeletal:        General: Normal range of motion.     Cervical back: Normal range of motion.  Skin:    General: Skin is warm and dry.     Comments: Low transverse incision approximated and well healed. Tiny area right lateral with ~1-64mm superficial skin opening. No drainage. No edema or erythema.  Neurological:     General: No focal deficit present.     Mental Status: She is alert and oriented to person, place, and time.  Psychiatric:        Mood and Affect: Mood normal.        Behavior: Behavior normal.   MAU Course  Procedures  MDM Incision healing well, suspect superficial disruption is from rubbing of clothes. No signs of dehiscence or infection, recommend keeping covered with Band-Aid. Also states she is interested in [place]. Stable for discharge home.   Assessment and Plan   1. Status post cesarean section    Discharge home Follow up at [facility] to discuss  [place] as of 03/28/2021   No Known Allergies      Medication List     STOP taking these medications    ferrous sulfate 325 (65 FE) MG EC tablet   ibuprofen 600 MG tablet Commonly known as: ADVIL   oxyCODONE 5 MG immediate release tablet Commonly known as: Roxicodone       TAKE these medications    Prenatal 19 29-1 MG Tabs Take 1 tablet by mouth daily.        Julianne Handler, CNM 03/28/2021, 4:05 PM

## 2021-03-28 NOTE — MAU Note (Signed)
Noted bleeding from incision on rt side noted 2 days.  Is red, blood on finger when she touched it. Was having pain, but not now.

## 2023-12-10 ENCOUNTER — Ambulatory Visit: Admitting: Family
